# Patient Record
Sex: Female | Born: 1975 | Race: Black or African American | Hispanic: No | Marital: Married | Smoking: Former smoker
Health system: Southern US, Community
[De-identification: ages and names within clinical notes are randomized; demographics above are authoritative.]

## PROBLEM LIST (undated history)

## (undated) DIAGNOSIS — F329 Major depressive disorder, single episode, unspecified: Secondary | ICD-10-CM

## (undated) DIAGNOSIS — E669 Obesity, unspecified: Secondary | ICD-10-CM

## (undated) DIAGNOSIS — E785 Hyperlipidemia, unspecified: Secondary | ICD-10-CM

## (undated) DIAGNOSIS — F32A Depression, unspecified: Secondary | ICD-10-CM

## (undated) DIAGNOSIS — M797 Fibromyalgia: Secondary | ICD-10-CM

## (undated) HISTORY — DX: Major depressive disorder, single episode, unspecified: F32.9

## (undated) HISTORY — PX: ABDOMINAL HYSTERECTOMY: SHX81

## (undated) HISTORY — PX: HERNIA REPAIR: SHX51

## (undated) HISTORY — DX: Hyperlipidemia, unspecified: E78.5

## (undated) HISTORY — DX: Depression, unspecified: F32.A

## (undated) HISTORY — DX: Fibromyalgia: M79.7

## (undated) HISTORY — PX: TUBAL LIGATION: SHX77

## (undated) HISTORY — DX: Obesity, unspecified: E66.9

---

## 1997-08-12 ENCOUNTER — Emergency Department (HOSPITAL_COMMUNITY): Admission: EM | Admit: 1997-08-12 | Discharge: 1997-08-12 | Payer: Self-pay | Admitting: Emergency Medicine

## 1997-08-13 ENCOUNTER — Ambulatory Visit (HOSPITAL_COMMUNITY): Admission: RE | Admit: 1997-08-13 | Discharge: 1997-08-13 | Payer: Self-pay | Admitting: Emergency Medicine

## 1998-04-28 ENCOUNTER — Inpatient Hospital Stay (HOSPITAL_COMMUNITY): Admission: AD | Admit: 1998-04-28 | Discharge: 1998-04-28 | Payer: Self-pay | Admitting: Obstetrics and Gynecology

## 1998-05-21 ENCOUNTER — Inpatient Hospital Stay (HOSPITAL_COMMUNITY): Admission: AD | Admit: 1998-05-21 | Discharge: 1998-05-21 | Payer: Self-pay | Admitting: Obstetrics and Gynecology

## 1998-06-11 ENCOUNTER — Inpatient Hospital Stay (HOSPITAL_COMMUNITY): Admission: AD | Admit: 1998-06-11 | Discharge: 1998-06-11 | Payer: Self-pay | Admitting: Obstetrics and Gynecology

## 1998-06-14 ENCOUNTER — Inpatient Hospital Stay (HOSPITAL_COMMUNITY): Admission: AD | Admit: 1998-06-14 | Discharge: 1998-06-14 | Payer: Self-pay | Admitting: Obstetrics & Gynecology

## 1998-06-21 ENCOUNTER — Inpatient Hospital Stay (HOSPITAL_COMMUNITY): Admission: AD | Admit: 1998-06-21 | Discharge: 1998-06-21 | Payer: Self-pay | Admitting: Obstetrics and Gynecology

## 1998-06-23 ENCOUNTER — Inpatient Hospital Stay (HOSPITAL_COMMUNITY): Admission: AD | Admit: 1998-06-23 | Discharge: 1998-06-27 | Payer: Self-pay | Admitting: Obstetrics and Gynecology

## 1998-08-18 ENCOUNTER — Emergency Department (HOSPITAL_COMMUNITY): Admission: EM | Admit: 1998-08-18 | Discharge: 1998-08-18 | Payer: Self-pay | Admitting: Emergency Medicine

## 1998-08-18 ENCOUNTER — Encounter: Payer: Self-pay | Admitting: Emergency Medicine

## 1999-10-11 ENCOUNTER — Encounter: Admission: RE | Admit: 1999-10-11 | Discharge: 1999-10-11 | Payer: Self-pay | Admitting: Emergency Medicine

## 1999-10-11 ENCOUNTER — Encounter: Payer: Self-pay | Admitting: Emergency Medicine

## 1999-10-28 ENCOUNTER — Other Ambulatory Visit: Admission: RE | Admit: 1999-10-28 | Discharge: 1999-10-28 | Payer: Self-pay | Admitting: Obstetrics and Gynecology

## 1999-12-07 ENCOUNTER — Encounter: Payer: Self-pay | Admitting: Obstetrics and Gynecology

## 1999-12-07 ENCOUNTER — Inpatient Hospital Stay (HOSPITAL_COMMUNITY): Admission: AD | Admit: 1999-12-07 | Discharge: 1999-12-07 | Payer: Self-pay | Admitting: Obstetrics and Gynecology

## 2000-05-20 ENCOUNTER — Inpatient Hospital Stay (HOSPITAL_COMMUNITY): Admission: AD | Admit: 2000-05-20 | Discharge: 2000-05-20 | Payer: Self-pay | Admitting: Obstetrics and Gynecology

## 2000-06-06 ENCOUNTER — Inpatient Hospital Stay (HOSPITAL_COMMUNITY): Admission: AD | Admit: 2000-06-06 | Discharge: 2000-06-06 | Payer: Self-pay | Admitting: Obstetrics and Gynecology

## 2000-06-18 ENCOUNTER — Inpatient Hospital Stay (HOSPITAL_COMMUNITY): Admission: AD | Admit: 2000-06-18 | Discharge: 2000-06-18 | Payer: Self-pay | Admitting: Obstetrics and Gynecology

## 2000-07-03 ENCOUNTER — Encounter: Payer: Self-pay | Admitting: *Deleted

## 2000-07-03 ENCOUNTER — Inpatient Hospital Stay (HOSPITAL_COMMUNITY): Admission: AD | Admit: 2000-07-03 | Discharge: 2000-07-07 | Payer: Self-pay | Admitting: *Deleted

## 2000-07-03 ENCOUNTER — Encounter (INDEPENDENT_AMBULATORY_CARE_PROVIDER_SITE_OTHER): Payer: Self-pay | Admitting: Specialist

## 2000-08-13 ENCOUNTER — Other Ambulatory Visit: Admission: RE | Admit: 2000-08-13 | Discharge: 2000-08-13 | Payer: Self-pay | Admitting: Obstetrics and Gynecology

## 2000-12-27 ENCOUNTER — Other Ambulatory Visit: Admission: RE | Admit: 2000-12-27 | Discharge: 2000-12-27 | Payer: Self-pay | Admitting: Obstetrics and Gynecology

## 2001-10-03 ENCOUNTER — Other Ambulatory Visit: Admission: RE | Admit: 2001-10-03 | Discharge: 2001-10-03 | Payer: Self-pay | Admitting: Obstetrics and Gynecology

## 2001-10-14 ENCOUNTER — Encounter: Admission: RE | Admit: 2001-10-14 | Discharge: 2002-01-12 | Payer: Self-pay | Admitting: Obstetrics and Gynecology

## 2002-01-13 ENCOUNTER — Encounter: Admission: RE | Admit: 2002-01-13 | Discharge: 2002-04-13 | Payer: Self-pay | Admitting: Obstetrics and Gynecology

## 2002-08-13 ENCOUNTER — Emergency Department (HOSPITAL_COMMUNITY): Admission: EM | Admit: 2002-08-13 | Discharge: 2002-08-14 | Payer: Self-pay | Admitting: Emergency Medicine

## 2002-08-14 ENCOUNTER — Encounter: Payer: Self-pay | Admitting: Emergency Medicine

## 2002-10-21 ENCOUNTER — Other Ambulatory Visit: Admission: RE | Admit: 2002-10-21 | Discharge: 2002-10-21 | Payer: Self-pay | Admitting: Obstetrics and Gynecology

## 2003-02-06 ENCOUNTER — Encounter: Admission: RE | Admit: 2003-02-06 | Discharge: 2003-02-06 | Payer: Self-pay | Admitting: Surgery

## 2003-02-11 ENCOUNTER — Encounter: Admission: RE | Admit: 2003-02-11 | Discharge: 2003-05-12 | Payer: Self-pay | Admitting: Surgery

## 2003-02-25 ENCOUNTER — Inpatient Hospital Stay (HOSPITAL_COMMUNITY): Admission: RE | Admit: 2003-02-25 | Discharge: 2003-02-25 | Payer: Self-pay | Admitting: Obstetrics & Gynecology

## 2003-03-13 ENCOUNTER — Ambulatory Visit (HOSPITAL_COMMUNITY): Admission: RE | Admit: 2003-03-13 | Discharge: 2003-03-13 | Payer: Self-pay | Admitting: Obstetrics and Gynecology

## 2003-04-22 ENCOUNTER — Encounter: Admission: RE | Admit: 2003-04-22 | Discharge: 2003-04-22 | Payer: Self-pay | Admitting: Emergency Medicine

## 2004-12-12 IMAGING — CT CT ABDOMEN W/ CM
1 of 2 series · 15 of 32 positions shown, 19 images · IV contrast ([ID] READICT  & [ID] OMNI/300)
Comparison: none

CLINICAL DATA: Right-sided lower abdominal and pelvic pain.  Previous caesarean section and tubal ligation.  Suspect adhesions or bowel obstruction.
TECHNIQUE: Spiral CT of the abdomen and pelvis was performed during administration of 150 cc Omnipaque 300 intravenous contrast.  Oral contrast was also administered.
 ABDOMEN CT WITH CONTRAST
 The abdominal parenchymal organs are normal in appearance.  The gallbladder is unremarkable.  There is no evidence of abnormal soft tissue masses or lymphadenopathy.
 There is no evidence of dilated bowel loops.  There is no evidence of inflammatory process or abnormal fluid collections in the abdomen.
 IMPRESSION 
 Negative abdomen CT.
 PELVIS CT WITH CONTRAST
 There is no evidence of pelvic masses or adenopathy.  The uterus is normal in size.  There is no evidence of inflammatory process or abnormal fluid collections within the abdomen.  There is no evidence of dilated bowel loops.  There is no evidence of hernia or other significant abnormality.
 Negative pelvis CT.
 [REDACTED]

[Series 2: — · axial · 0.66mm/px · z∈[-398,-40]mm · 15 of 106 slices shown, 19 images]
[im 5/106  soft-tissue]
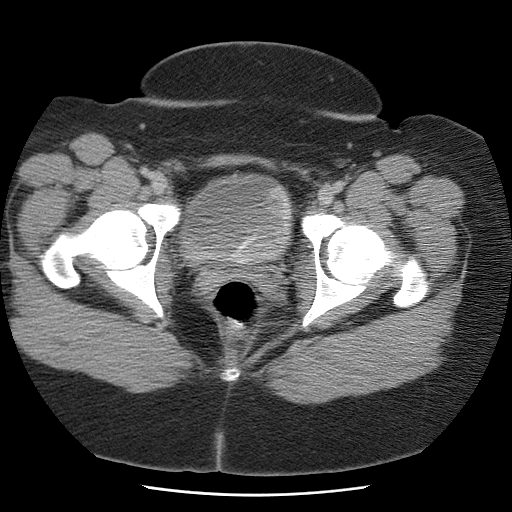
[im 5/106  bone]
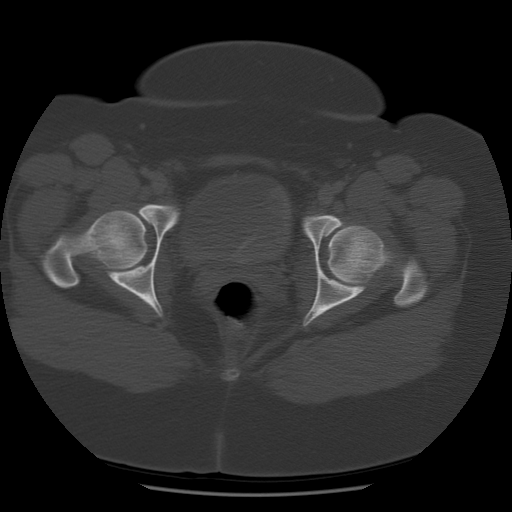
[im 13/106  soft-tissue]
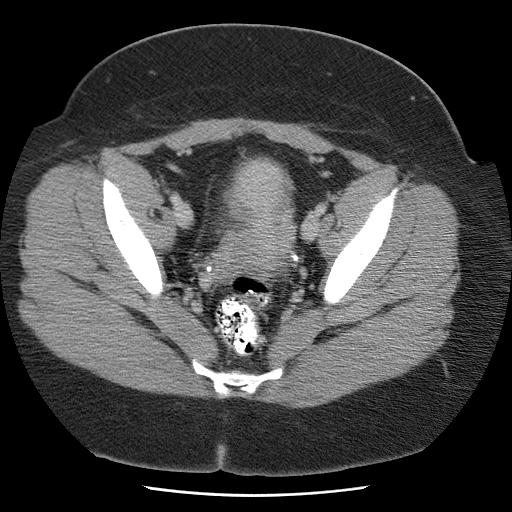
[im 21/106  soft-tissue]
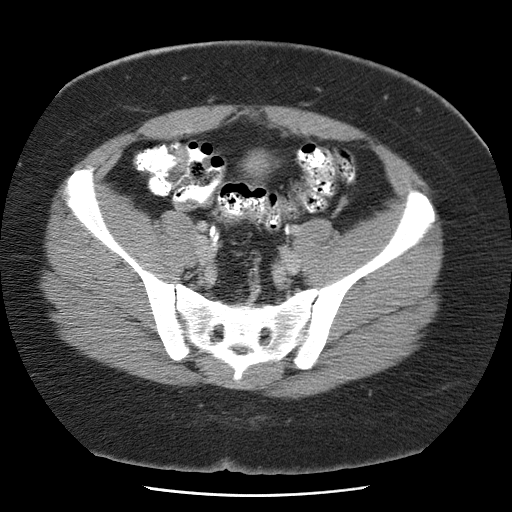
[im 29/106  soft-tissue]
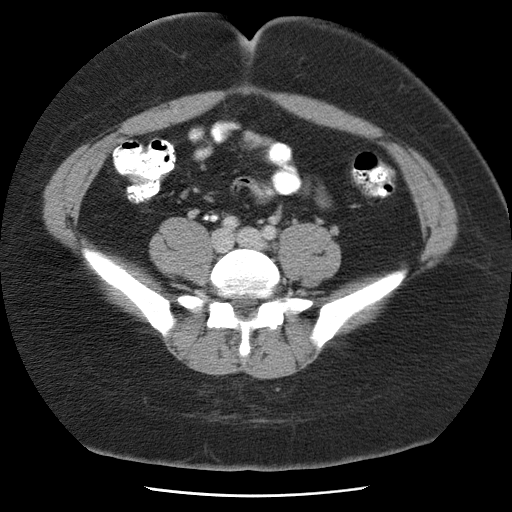
[im 37/106  soft-tissue]
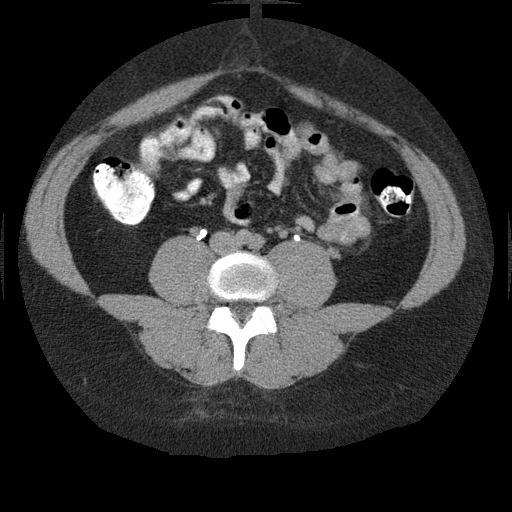
[im 45/106  soft-tissue]
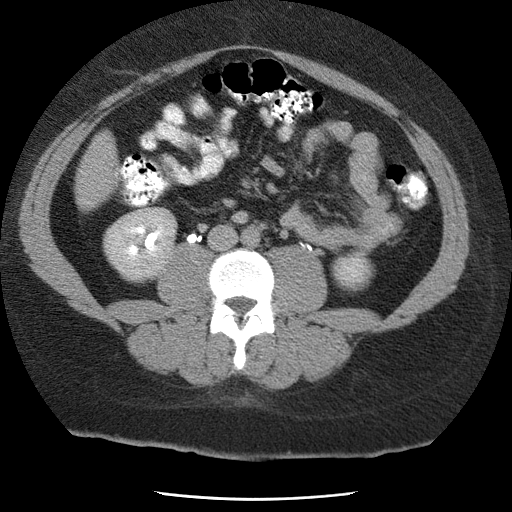
[im 53/106  soft-tissue]
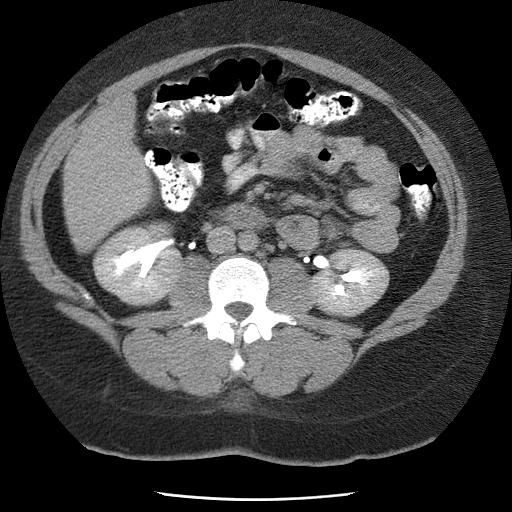
[im 61/106  soft-tissue]
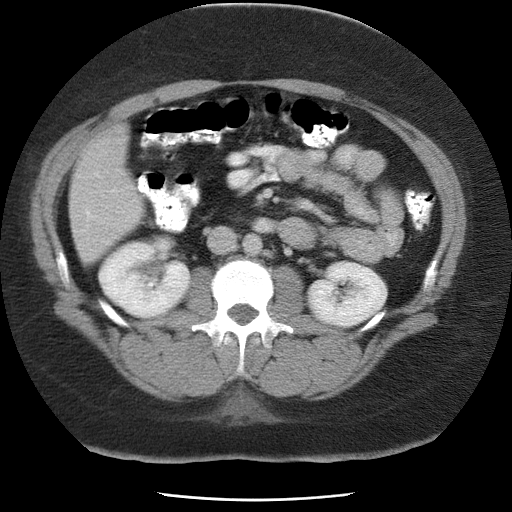
[im 69/106  soft-tissue]
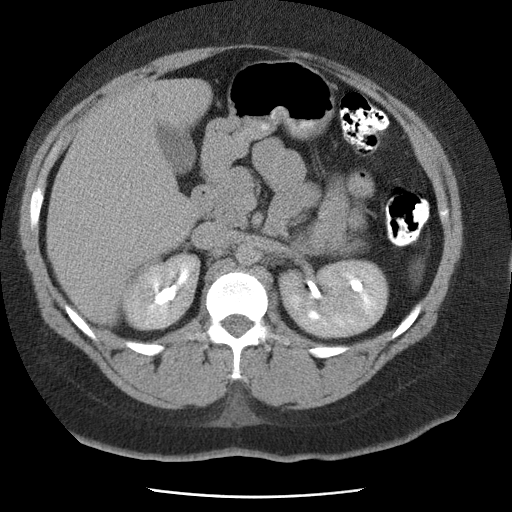
[im 69/106  bone]
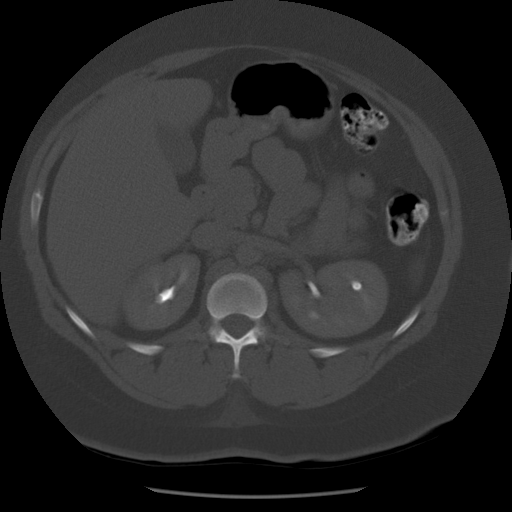
[im 77/106  soft-tissue]
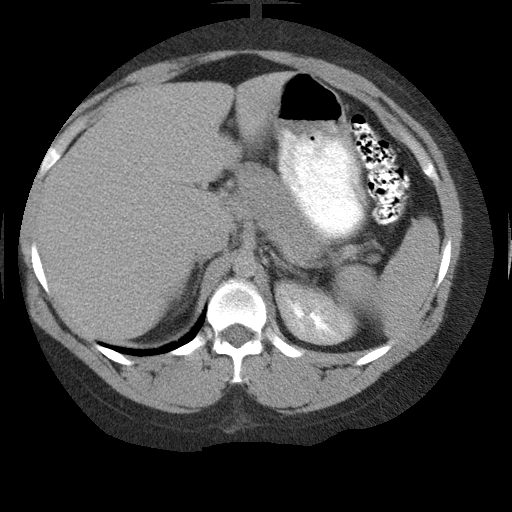
[im 85/106  soft-tissue]
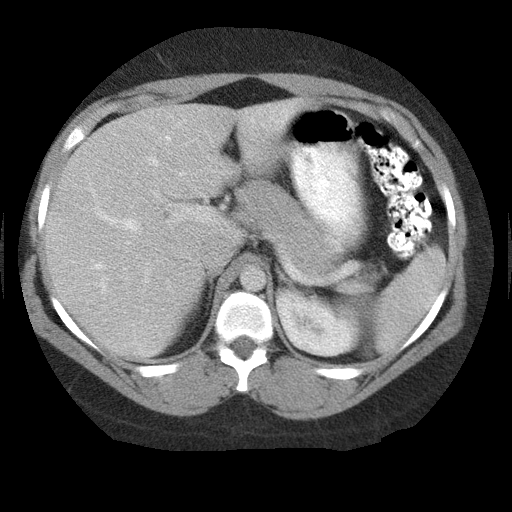
[im 89/106  lung]
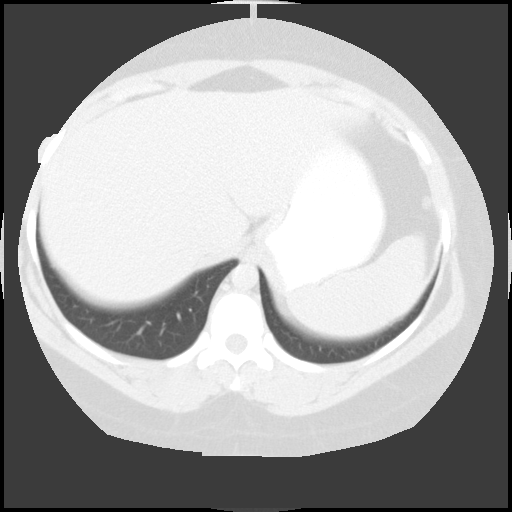
[im 93/106  soft-tissue]
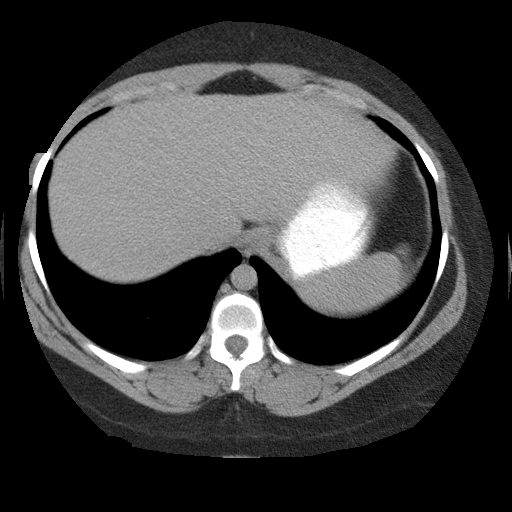
[im 93/106  lung]
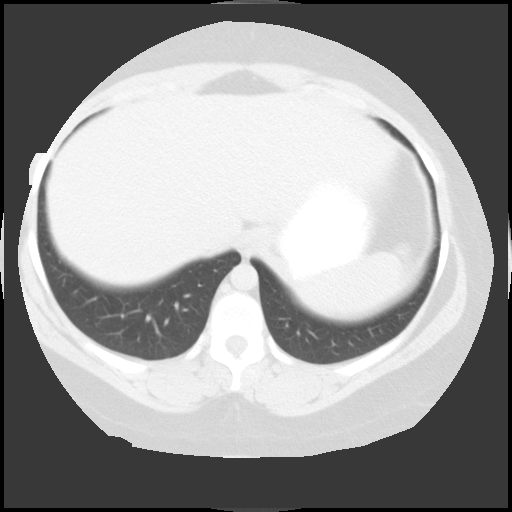
[im 97/106  lung]
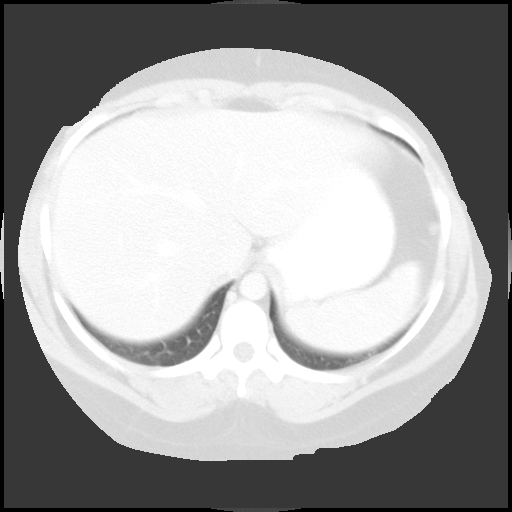
[im 101/106  soft-tissue]
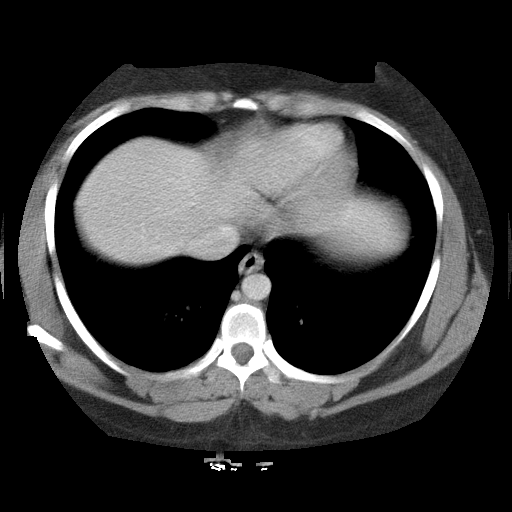
[im 101/106  lung]
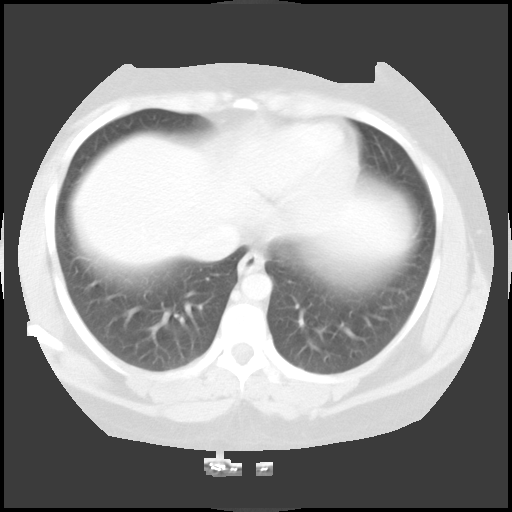

[15 of 32 positions shown; findings below may reference images not displayed]

## 2004-12-20 ENCOUNTER — Emergency Department (HOSPITAL_COMMUNITY): Admission: EM | Admit: 2004-12-20 | Discharge: 2004-12-20 | Payer: Self-pay | Admitting: Family Medicine

## 2004-12-28 ENCOUNTER — Emergency Department (HOSPITAL_COMMUNITY): Admission: EM | Admit: 2004-12-28 | Discharge: 2004-12-28 | Payer: Self-pay | Admitting: Emergency Medicine

## 2005-09-06 ENCOUNTER — Emergency Department (HOSPITAL_COMMUNITY): Admission: EM | Admit: 2005-09-06 | Discharge: 2005-09-06 | Payer: Self-pay | Admitting: Emergency Medicine

## 2005-10-02 ENCOUNTER — Emergency Department (HOSPITAL_COMMUNITY): Admission: EM | Admit: 2005-10-02 | Discharge: 2005-10-02 | Payer: Self-pay | Admitting: *Deleted

## 2005-10-19 ENCOUNTER — Emergency Department (HOSPITAL_COMMUNITY): Admission: EM | Admit: 2005-10-19 | Discharge: 2005-10-20 | Payer: Self-pay | Admitting: Emergency Medicine

## 2006-05-07 ENCOUNTER — Emergency Department (HOSPITAL_COMMUNITY): Admission: EM | Admit: 2006-05-07 | Discharge: 2006-05-07 | Payer: Self-pay | Admitting: Family Medicine

## 2006-05-14 ENCOUNTER — Emergency Department (HOSPITAL_COMMUNITY): Admission: EM | Admit: 2006-05-14 | Discharge: 2006-05-14 | Payer: Self-pay | Admitting: Emergency Medicine

## 2006-09-11 ENCOUNTER — Emergency Department (HOSPITAL_COMMUNITY): Admission: EM | Admit: 2006-09-11 | Discharge: 2006-09-11 | Payer: Self-pay | Admitting: Emergency Medicine

## 2007-01-08 ENCOUNTER — Encounter (INDEPENDENT_AMBULATORY_CARE_PROVIDER_SITE_OTHER): Payer: Self-pay | Admitting: Obstetrics and Gynecology

## 2007-01-08 ENCOUNTER — Ambulatory Visit (HOSPITAL_COMMUNITY): Admission: RE | Admit: 2007-01-08 | Discharge: 2007-01-09 | Payer: Self-pay | Admitting: Obstetrics and Gynecology

## 2007-12-19 ENCOUNTER — Emergency Department (HOSPITAL_COMMUNITY): Admission: EM | Admit: 2007-12-19 | Discharge: 2007-12-19 | Payer: Self-pay | Admitting: Emergency Medicine

## 2008-12-01 ENCOUNTER — Inpatient Hospital Stay (HOSPITAL_COMMUNITY): Admission: AD | Admit: 2008-12-01 | Discharge: 2008-12-01 | Payer: Self-pay | Admitting: Obstetrics and Gynecology

## 2009-11-12 ENCOUNTER — Other Ambulatory Visit: Admission: RE | Admit: 2009-11-12 | Discharge: 2009-11-12 | Payer: Self-pay | Admitting: Family Medicine

## 2010-02-23 ENCOUNTER — Encounter: Admission: RE | Admit: 2010-02-23 | Discharge: 2010-02-23 | Payer: Self-pay | Admitting: Family Medicine

## 2010-03-21 ENCOUNTER — Ambulatory Visit (HOSPITAL_COMMUNITY)
Admission: RE | Admit: 2010-03-21 | Discharge: 2010-03-23 | Payer: Self-pay | Source: Home / Self Care | Attending: General Surgery | Admitting: General Surgery

## 2010-04-30 ENCOUNTER — Encounter: Payer: Self-pay | Admitting: Emergency Medicine

## 2010-06-20 LAB — CBC
HCT: 36.9 % (ref 36.0–46.0)
Hemoglobin: 11.8 g/dL — ABNORMAL LOW (ref 12.0–15.0)
MCH: 27.3 pg (ref 26.0–34.0)
MCHC: 32 g/dL (ref 30.0–36.0)
MCV: 85.2 fL (ref 78.0–100.0)
Platelets: 311 10*3/uL (ref 150–400)
RBC: 4.33 MIL/uL (ref 3.87–5.11)
RDW: 13.2 % (ref 11.5–15.5)
WBC: 10.9 10*3/uL — ABNORMAL HIGH (ref 4.0–10.5)

## 2010-06-20 LAB — SURGICAL PCR SCREEN
MRSA, PCR: NEGATIVE
Staphylococcus aureus: NEGATIVE

## 2010-07-16 LAB — WET PREP, GENITAL
Clue Cells Wet Prep HPF POC: NONE SEEN
Trich, Wet Prep: NONE SEEN
Yeast Wet Prep HPF POC: NONE SEEN

## 2010-07-16 LAB — URINALYSIS, ROUTINE W REFLEX MICROSCOPIC
Bilirubin Urine: NEGATIVE
Glucose, UA: NEGATIVE mg/dL
Hgb urine dipstick: NEGATIVE
Ketones, ur: NEGATIVE mg/dL
Nitrite: NEGATIVE
Protein, ur: NEGATIVE mg/dL
Specific Gravity, Urine: 1.02 (ref 1.005–1.030)
Urobilinogen, UA: 0.2 mg/dL (ref 0.0–1.0)
pH: 7.5 (ref 5.0–8.0)

## 2010-07-17 ENCOUNTER — Emergency Department (HOSPITAL_COMMUNITY): Payer: BC Managed Care – PPO

## 2010-07-17 ENCOUNTER — Emergency Department (HOSPITAL_COMMUNITY)
Admission: EM | Admit: 2010-07-17 | Discharge: 2010-07-17 | Disposition: A | Discharge status: Home / Self Care | Payer: BC Managed Care – PPO | Attending: Emergency Medicine | Admitting: Emergency Medicine

## 2010-07-17 DIAGNOSIS — R05 Cough: Secondary | ICD-10-CM | POA: Insufficient documentation

## 2010-07-17 DIAGNOSIS — J3489 Other specified disorders of nose and nasal sinuses: Secondary | ICD-10-CM | POA: Insufficient documentation

## 2010-07-17 DIAGNOSIS — R059 Cough, unspecified: Secondary | ICD-10-CM | POA: Insufficient documentation

## 2010-07-17 DIAGNOSIS — R0602 Shortness of breath: Secondary | ICD-10-CM | POA: Insufficient documentation

## 2010-07-17 LAB — BASIC METABOLIC PANEL
BUN: 9 mg/dL (ref 6–23)
CO2: 23 mEq/L (ref 19–32)
Calcium: 9 mg/dL (ref 8.4–10.5)
Chloride: 102 mEq/L (ref 96–112)
Creatinine, Ser: 0.66 mg/dL (ref 0.4–1.2)
GFR calc Af Amer: >60 mL/min (ref >60)
GFR calc non Af Amer: >60 mL/min (ref >60)
Glucose, Bld: 93 mg/dL (ref 70–99)
Potassium: 3.9 mEq/L (ref 3.5–5.1)
Sodium: 137 mEq/L (ref 135–145)

## 2010-07-17 LAB — CBC
HCT: 36.1 % (ref 36.0–46.0)
Hemoglobin: 11.6 g/dL — ABNORMAL LOW (ref 12.0–15.0)
MCH: 26.8 pg (ref 26.0–34.0)
MCHC: 32.1 g/dL (ref 30.0–36.0)
MCV: 83.4 fL (ref 78.0–100.0)
Platelets: 323 10*3/uL (ref 150–400)
RBC: 4.33 MIL/uL (ref 3.87–5.11)
RDW: 13.7 % (ref 11.5–15.5)
WBC: 12.7 10*3/uL — ABNORMAL HIGH (ref 4.0–10.5)

## 2010-07-17 LAB — DIFFERENTIAL
Basophils Absolute: 0 10*3/uL (ref 0.0–0.1)
Basophils Relative: 0 % (ref 0–1)
Eosinophils Absolute: 0.2 10*3/uL (ref 0.0–0.7)
Eosinophils Relative: 2 % (ref 0–5)
Lymphocytes Relative: 27 % (ref 12–46)
Lymphs Abs: 3.4 10*3/uL (ref 0.7–4.0)
Monocytes Absolute: 0.6 10*3/uL (ref 0.1–1.0)
Monocytes Relative: 5 % (ref 3–12)
Neutro Abs: 8.5 10*3/uL — ABNORMAL HIGH (ref 1.7–7.7)
Neutrophils Relative %: 67 % (ref 43–77)

## 2010-07-17 LAB — D-DIMER, QUANTITATIVE: D-Dimer, Quant: <0.22 ug/mL-FEU (ref 0.00–0.48)

## 2010-07-17 LAB — POCT CARDIAC MARKERS
CKMB, poc: <1 ng/mL — ABNORMAL LOW (ref 1.0–8.0)
Myoglobin, poc: 75.2 ng/mL (ref 12–200)
Troponin i, poc: <0.05 ng/mL (ref 0.00–0.09)

## 2010-07-17 LAB — BRAIN NATRIURETIC PEPTIDE: Pro B Natriuretic peptide (BNP): <30 pg/mL (ref 0.0–100.0)

## 2010-08-23 NOTE — H&P (Signed)
Ana Wolfe, Ana Wolfe               ACCOUNT NO.:  [id number]   MEDICAL RECORD NO.:  [id number]          PATIENT TYPE:  AMB   LOCATION:  SDC                           FACILITY:  [facility]   PHYSICIAN:  Guy Sandifer. Henderson Cloud, M.D. DATE OF BIRTH:  Mar 21, 1976   DATE OF ADMISSION:  01/08/2007  DATE OF DISCHARGE:                              HISTORY & PHYSICAL   PRIORITY PREADMISSION HISTORY AND PHYSICAL   CHIEF COMPLAINT:  Heavy menses.   HISTORY OF PRESENT ILLNESS:  This patient is a 35 year old, married,  black female, G3, P2, status post tubal ligation, with increasingly  heavy menses.  She changes a tampon and a pad every 30 to 60 minutes and  overflows pads.  Ultrasound in my office on October 16, 2006, revealed the  uterus measuring 9.5 x 4.5 x 5.8 cm.  Sonohystogram was negative for  intracavitary masses.  The right ovary contained a 1.9 cm simple cyst at  that time.  After a discussion of options and management, she is being  admitted for laparoscopically-assisted vaginal hysterectomy.  Potential  risks and complications have been discussed with the patient  preoperatively.  Of note, the patient adamantly declines transfusion of  any human blood products.  However, she will accept synthetic blood  expanders.   PAST MEDICAL HISTORY:  1. HSV.  2. History of pelvic adhesions.   PAST SURGICAL HISTORY:  1. Laparoscopy with lysis of adhesions in 2005.  2. Left knee arthroscopy.   OBSTETRIC HISTORY:  Cesarean section x2.   FAMILY HISTORY:  Negative for coronary artery disease, cancer, and  diabetes.   MEDICATIONS:  None.  No known drug allergies   SOCIAL HISTORY:  Denies tobacco, alcohol, or drug abuse.   REVIEW OF SYSTEMS:  NEURO:  Denies headache.  CARDIO:  No chest pain.  PULMONARY:  Denies shortness of breath.  GI:  Denies recent changes in  bowel habits.   PHYSICAL EXAMINATION:  VITAL SIGNS:  Height 5 feet 0 inches, weight 233  pounds, blood pressure 120/84.  HEENT:  Without  thyromegaly.  LUNGS:  Clear to auscultation.  HEART:  Regular rate and rhythm.  BACK:  Without CVA tenderness.  BREASTS:  Without masses or nipple discharge.  ABDOMEN:  Obese, soft, and nontender without palpable masses.  PELVIC EXAM:  Vagina and cervix without lesion; uterus normal size,  mobile, and nontender; adnexa nontender without masses.  EXTREMITIES:  Grossly within normal limits.  NEUROLOGICAL EXAM:  Grossly within normal limits.   ASSESSMENT:  Menorrhagia.   PLAN:  Laparoscopically-assisted vaginal hysterectomy.      Guy Sandifer Henderson Cloud, M.D.  Electronically Signed     JET/MEDQ  D:  01/02/2007  T:  01/02/2007  Job:  [id number]

## 2010-08-23 NOTE — Discharge Summary (Signed)
[name], Ana Wolfe               ACCOUNT NO.:  [id number]   MEDICAL RECORD NO.:  [id number]          PATIENT TYPE:  OIB   LOCATION:  9307                          FACILITY:  [facility]   PHYSICIAN:  Guy Sandifer. Henderson Cloud, M.D. DATE OF BIRTH:  June 12, 1975   DATE OF ADMISSION:  01/08/2007  DATE OF DISCHARGE:  01/09/2007                               DISCHARGE SUMMARY   PROCEDURE:  On January 08, 2007, laparoscopically-assisted vaginal  hysterectomy.   REASON FOR ADMISSION:  This patient is a 35 year old married black  female G3, P2 status post tubal integration with increasingly heavy  menses.  Details are dictated in the history and physical.  She is  admitted for surgical management.   HOSPITAL COURSE:  The patient is taken to the operating room and  undergoes the above procedure.  Estimated blood loss is 150 mL.  On the  evening of surgery she has good pain relief.  She had some nausea and  vomiting with food but was feeling better after that.  Vital signs were  stable and she was afebrile with clear urine output.  On the day of  discharge she is tolerating regular diet, passing flatus and has good  pain control.  Vital signs are stable.  She remains afebrile.  Hemoglobin is 10.5 and pathology is pending.   CONDITION ON DISCHARGE:  Good.   DIET:  Regular as tolerated.   ACTIVITY:  No lifting, no operation of automobiles, no vaginal entry.  She is to call the office for problems including, but not limited to  temperature of 101 degrees, persistent nausea, vomiting, heavy bleeding  or increasing pain.   MEDICATIONS:  1. Percocet 5/325 mg #40 one to two p.o. q.6h. p.r.n.  2. Ibuprofen 600 mg q.6h. p.r.n.  3. Iron tablet daily.   FOLLOW-UP:  In the office in two weeks.      Guy Sandifer Henderson Cloud, M.D.  Electronically Signed     JET/MEDQ  D:  01/09/2007  T:  01/09/2007  Job:  [id number]

## 2010-08-23 NOTE — Op Note (Signed)
Ana Wolfe, Ana Wolfe               ACCOUNT NO.:  [id number]   MEDICAL RECORD NO.:  [id number]          PATIENT TYPE:  OIB   LOCATION:  9307                          FACILITY:  [facility]   PHYSICIAN:  Guy Sandifer. Henderson Cloud, M.D. DATE OF BIRTH:  May 02, 1975   DATE OF PROCEDURE:  01/08/2007  DATE OF DISCHARGE:                               OPERATIVE REPORT   PREOPERATIVE DIAGNOSIS:  Menorrhagia.   POSTOPERATIVE DIAGNOSIS:  Menorrhagia.   PROCEDURE:  Laparoscopically assisted vaginal hysterectomy.   SURGEON:  Guy Sandifer. Henderson Cloud, M.D.   ASSISTANT:  Juluis Mire, M.D.   ANESTHESIA:  General with endotracheal intubation.   SPECIMENS:  Uterus to Pathology.   ESTIMATED BLOOD LOSS:  150 mL.   INDICATIONS AND CONSENT:  This patient is a 35 year old married black  female G3, P2, status post tubal ligation with increasingly heavy  menses.  Details are dictated in the history and physical.  The patient  is admitted for surgical management.  Laparoscopically assisted vaginal  hysterectomy and removal of an ovary only if distinctly abnormal have  been discussed preoperatively.  Potential risks and complications are  discussed including, but limited to, infection, organ damage, bleeding,  DVT, PE and pneumonia.  Laparotomy, fistula formation and dyspareunia  are discussed.  The patient adamantly refuses the transfusion of blood  products even in the event of life-threatening hemorrhage.  She will  except synthetic products of any sort.  All questions have been answered  and consent is signed and on the chart.   FINDINGS:  There are omental adhesions to the anterior abdominal wall  superior to the umbilicus.  Uterus is about 10 weeks in size.  There are  filmy adhesions of the peritoneum to the left lower uterine segment.  Posterior cul-de-sac is normal.  Ovaries are normal bilaterally.   PROCEDURE:  The patient is taken to the operating room, where she is  identified, placed in the dorsal supine  position and general anesthesia  is induced via endotracheal intubation.  She is then placed in the  dorsal lithotomy position, where she is prepped abdominally and  vaginally, bladder straight-catheterized, Hulka tenaculum is placed in  the uterus as manipulator and she is draped in a sterile fashion.  The  infraumbilical and suprapubic areas are injected in the midline with  0.5% plain Marcaine.  An infraumbilical incision is made and dissection  is carried out in layers to the peritoneum, which is bluntly entered  with a fingertip.  The fascia is marked at the 3  and 9 o'clock  positions with a 0 Vicryl suture, which is placed under good  visualization.  The disposable Hasson trocar sleeve is then placed and  anchored down with the angle sutures.  Pneumoperitoneum is induced and  the above findings are noted.  A small suprapubic incision is made and a  5-mm Xcel bladeless disposable trocar sleeve is placed under direct  visualization without difficulty.  The adhesions on the left lower  uterine segment and around the left round ligament are taken down  sharply and bluntly without difficulty.  Then using the Gyrus  bipolar  cautery cutting instrument, the proximal ligaments are taken down  bilaterally to the level of the vesicouterine peritoneum.  The  vesicouterine peritoneum is taken down cephalolaterally as well.  Good  hemostasis is noted.  Instruments are removed and the suprapubic trocar  sleeve is removed and attention is turned to the vagina.  Posterior cul-  de-sac is entered sharply and the cervix was circumscribed with unipolar  cautery.  Mucosa is advanced sharply and bluntly.  Posterior cul-de-sac  is entered.  The progressive bites are taken with the Gyrus bipolar  cautery instrument of the uterosacral ligaments, bladder pillars,  cardinal ligaments and uterine vessels bilaterally.  Anterior cul-de-sac  is entered during this process without difficulty.  The fundus is   delivered posteriorly and the specimen is completely delivered from the  field.  The uterosacral ligaments are then plicated to the vaginal cuff  bilaterally with separate sutures of 0 Monocryl.  A third suture of 0  Monocryl was used to reapproximate the uterosacral ligaments in the  midline.  Cuff is then closed with figure-of-eight 0 Monocryl suture.  Foley catheter is placed in the bladder and clear urine is noted.  Attention is returned to the abdomen.  Pneumoperitoneum is reintroduced  and under direct visualization, the 5-mm bladeless trocar sleeve is  reintroduced under direct visualization.  Copious irrigation is carried  out and careful inspection under reduced pneumoperitoneum reveals  excellent hemostasis all around.  Excess fluid is removed.  Suprapubic  trocar sleeve is removed and the Hasson disposable trocar sleeve is  removed as well.  The angle sutures are tied in the midline.  The defect  in the fascia is completely closed with the 0 Monocryl with bites taken  under good visualization.  The skin is closed with 3-0 Vicryl with  simple sutures.  Dermabond is placed on both incisions.  All counts are  correct.  The patient is awakened and taken to the recovery room in  stable condition.      Guy Sandifer Henderson Cloud, M.D.  Electronically Signed     JET/MEDQ  D:  01/08/2007  T:  01/08/2007  Job:  [id number]

## 2010-08-26 NOTE — Discharge Summary (Signed)
[facility]  Patient:    Ana Wolfe, Ana Wolfe                      MRN: [id number] Adm. Date:  [id number] Disc. Date: [id number] Attending:  Donne Hazel Dictator:   Danie Chandler, R.N.                           Discharge Summary  ADMITTING DIAGNOSES:          1. Intrauterine pregnancy at [redacted] weeks gestation.                               2. Labor.                               3. Abdominal pain suspicious for either uterine                                  scar dehiscence or occult placental                                  abruption.  DISCHARGE DIAGNOSES:          1. Intrauterine pregnancy at [redacted] weeks gestation.                               2. Labor.                               3. Abdominal pain suspicious for either uterine                                  scar dehiscence or occult placental                                  abruption.  PROCEDURE:                    On July 03, 2000 repeat low transverse cesarean section and bilateral tubal ligation.  REASON FOR ADMISSION:         The patient is a 35 year old married black female gravida 3, para 1 with an estimated date of confinement of July 31, 2000 with prenatal care complicated by a previous cesarean section, uterine leiomyoma, history of positive group B strep, and vulvar herpes.  The patient complained of approximately one week of waxing and waning low midline pain radiating to bilateral lower back.  The pain was becoming progressively worse. The patient was evaluated in the office where she was crying with the pain. The patient was transferred to the triage unit for further evaluation.  Fetal heart tones were reactive and the patient was having mild contractions approximately every three to six minutes.  The patient denied vaginal bleeding, trauma, fever, or rupture of membranes.  The patient was found to be afebrile.  PIH studies, coagulation studies, and straight urinalysis  were normal.  An ultrasound revealed normal amniotic fluid volume  and an anterior grade 2 placenta.  The patient was given IV fluids and continued to complain of significant suprapubic pain.  The uterus was soft but she was tender suprapubically.  No CVA tenderness was noted.  The possibilities of uterine scar separation or occult placental abruption were discussed.  The recommendation for cesarean section was made in view of the patients continued pain.  HOSPITAL COURSE:              The patient was taken to the operating room and underwent the above named procedure without complication.  This was productive of a viable female infant with Apgars of 8 at one minute and 9 at five minutes.  Postoperatively on day #1 the patient had a good return of bowel function and good pain control.  Hemoglobin was 9.3, hematocrit 27.9, and white blood cell count 12.2.  On postoperative day #2 the patient was without complaints.  She was tolerating a regular diet and ambulating well without difficulty.  She was discharged home on postoperative day #4.  CONDITION ON DISCHARGE:       Good.  DIET:                         Regular, as tolerated.  ACTIVITY:                     No heavy lifting, no driving, no vaginal entry.  FOLLOW-UP:                    She is to follow-up in the office in one to two weeks for incision check.  She is to call for temperature greater than 100 degrees, persistent nausea or vomiting, heavy vaginal bleeding, and/or redness or drainage from the incision site.  DISCHARGE MEDICATIONS:        1. Prenatal vitamins one p.o. q.d.                               2. Tylox #30 with no refill as directed by M.D.                               3. Ibuprofen as directed by M.D.DD:  07/20/00 TD:  07/20/00 Job: [id number]

## 2010-08-26 NOTE — Op Note (Signed)
[facility]  Patient:    Ana Wolfe, Ana Wolfe                      MRN: [id number] Proc. Date: 07/03/00 Adm. Date:  [id number] Attending:  Donne Hazel                           Operative Report  PREOPERATIVE DIAGNOSES:       1. Intrauterine pregnancy at 36-0/7 weeks.                               2. Labor.                               3. Abdominal pain suspicious for either                                  uterine scar dehiscence or occult placental                                  abruption.  POSTOPERATIVE DIAGNOSES:      1. Intrauterine pregnancy at 36-0/7 weeks.                               2. Labor.                               3. Abdominal pain suspicious for either                                  uterine scar dehiscence or occult placental                                  abruption.  PROCEDURES:                   1. Repeat low transverse cesarean section.                               2. Bilateral tubal ligation.  SURGEON:                      Guy Sandifer. Arleta Creek, M.D.  ASSISTANTWilley Blade, M.D.  ANESTHESIA:                   Spinal.  ANESTHESIOLOGIST:             Octaviano Glow. Pamalee Leyden, M.D.  ESTIMATED BLOOD LOSS:         500 cc.  FINDINGS:                     Viable female infant.  Apgars of 8 and 9 at one and five minutes respectively.  Birth weight and arterial cord pH pending.  INDICATIONS AND  CONSENT:      This patient is a 35 year old married black female, G3, P1, AB1 with an EDC of July 31, 2000, with prenatal care complicated by previous cesarean section, uterine leiomyoma, history of positive group B Strep and vulvar herpes.  The patient complained of approximately one week of waxing and waning low midline pain radiating to bilateral lower back.  The pain is becoming progressively worse.  She was evaluated in the office, where she was crying with the pain.  She was transferred to triage for further  evaluation.  Fetal heart tones were reactive and the patient was having mild contractions approximately every 3-6 minutes. The patient denied vaginal bleeding, trauma, fever or rupture of membranes. The patient was found to be afebrile.  PIH studies, coagulation studies and straight urinalysis were normal.  Ultrasound revealed normal amniotic fluid volume and an anterior grade 2 placenta.  The patient was given IV fluids and continued to complain of significant suprapubic pain.  The uterus was soft, although she was quite tender suprapubically.  No CVA tenderness was noted. The possibilities of uterine scar separation or occult placental abruption were discussed with the patient and her husband.  Possible catastrophic consequences if this should extend and cause fetal distress were discussed. The fact that the baby was 36 weeks, and therefore premature, and was subject to fetal lung immaturity or other problems of prematurity were discussed.  The recommendation for cesarean section was made in view of the patients continued pain.  The patient requested tubal ligation.  The issue of tubal ligation with the premature baby was discussed.  The permanence was emphasized.  The possible failure and increased ectopic risk was also discussed.  The risks of cesarean section were discussed.  The patient and husband were adamant in that they did not want transfusion of blood products. They stated quite clearly this was true even in the event of life threatening bleeding.  They stated this was for religious purposes.  All questions were answered and consent was signed on the chart.  DESCRIPTION OF PROCEDURE:     The patient was taken to the operating room, where a spinal anesthetic was placed.  She was placed I the dorsal supine position with a 15 degree left lateral wedge, where she was prepped and draped in a sterile fashion.  A Foley catheter was already in place.  After testing for adequate spinal  anesthesia, the skin was entered through the previous Pfannenstiel scar and dissection was carried out in layers to the peritoneum. The peritoneum was incised and extended superiorly and inferiorly.  The vesicouterine peritoneum was taken down cephalolaterally.  A bladder flap was developed and a bladder blade was placed.  There was no obvious rupture of the uterine scar noted.  The uterus was then incised in a low transverse manner and the uterine cavity was entered bluntly with a Kelly clamp.  The uterine incision was then extended cephalolaterally with the fingers.  Artificial rupture of membranes was carried out for clear fluid.  The vertex was delivered and the oral and nasopharynx were suctioned.  The remainder of the infant was then delivered.  Good cry and tone was noted and the cord was clamped and cut.  The infant was handed to the waiting pediatrics team.  The placenta was manually delivered and sent to pathology.  The uterine cavity was palpated clean and normal in contour.  The uterus was closed in a running locking fashion with a 0 Monocryl suture, which achieved  good hemostasis. Then, after again confirming with the patient that she wanted her tubal ligation done, the left fallopian tube was identified from cornu to fimbria. A window in the mesosalpinx was created with cautery.  Two 0 pain free ties were placed around the fallopian tube and the intervening section of tube was sharply resected.  Minimal cautery was used to assure hemostasis.  A similar procedure was carried out on the right fallopian tube.  The ovaries were normal.  There were some omental adhesions to the left uterine fundus, which were also taken down with cautery without difficulty.  The uterus was returned to the abdomen.  Irrigation was carried out and all returns were clear.  Good hemostasis was noted all around.  The anterior peritoneum was closed in a running fashion with 0 Monocryl suture, which was  also used to reapproximate the pyramidalis muscle in the midline.  The anterior rectus fascia was closed in a running fashion with 0 Panacryl suture.  The skin was closed with clips.  All sponge, needle and instrument counts were correct.  The patient was transferred to the recovery room in stable condition. DD:  07/03/00 TD:  07/04/00 Job: [id number]

## 2010-08-31 ENCOUNTER — Other Ambulatory Visit (INDEPENDENT_AMBULATORY_CARE_PROVIDER_SITE_OTHER): Payer: Self-pay | Admitting: General Surgery

## 2010-08-31 DIAGNOSIS — R1011 Right upper quadrant pain: Secondary | ICD-10-CM

## 2010-09-01 ENCOUNTER — Ambulatory Visit
Admission: RE | Admit: 2010-09-01 | Discharge: 2010-09-01 | Disposition: A | Discharge status: Home / Self Care | Payer: BLUE CROSS/BLUE SHIELD | Source: Ambulatory Visit | Attending: General Surgery | Admitting: General Surgery

## 2010-09-01 ENCOUNTER — Encounter: Payer: Self-pay | Admitting: Gastroenterology

## 2010-09-01 DIAGNOSIS — R1011 Right upper quadrant pain: Secondary | ICD-10-CM

## 2010-09-08 ENCOUNTER — Ambulatory Visit (INDEPENDENT_AMBULATORY_CARE_PROVIDER_SITE_OTHER): Payer: BC Managed Care – PPO | Admitting: Gastroenterology

## 2010-09-08 ENCOUNTER — Encounter: Payer: Self-pay | Admitting: Gastroenterology

## 2010-09-08 VITALS — BP 110/80 | HR 72 | Ht 60.0 in | Wt 249.0 lb

## 2010-09-08 DIAGNOSIS — R1011 Right upper quadrant pain: Secondary | ICD-10-CM | POA: Insufficient documentation

## 2010-09-08 MED ORDER — SULINDAC 200 MG PO TABS: 200.0000 mg | ORAL_TABLET | Freq: Two times a day (BID) | ORAL | Status: AC

## 2010-09-08 NOTE — Assessment & Plan Note (Addendum)
Pain is most likely due to musculoskeletal pain. There is no obvious hernia. Recent ultrasound was negative.  Recommendations #1 trial of Clinoril 200 mg twice a day for 14 days. Patient was instructed to call back if she's not improved on this regimen.

## 2010-09-08 NOTE — Patient Instructions (Signed)
We are sending in a new prescription to your pharmacy Call us back in 2 weeks if the medication is not helping

## 2010-09-08 NOTE — Progress Notes (Signed)
History of Present Illness:  Ms Rattan is a 35 year old Afro-American female referred after request of Dr. Bertram Savin for evaluation of abdominal pain. Since undergoing a laparoscopic periumbilical hernia repair several months ago she has been complaining of upper abdominal pain in the right upper quadrant upper epigastrium and left upper quadrant. It is worsened postprandially when she feels a tightness in her upper abdomen and occasionally accompanied by nausea. She denies pyrosis. She moves her bowels regularly. CT in November, 2011 demonstrated a small hernia. Ultrasound from yesterday was unrevealing.    Review of Systems: Pertinent positive and negative review of systems were noted in the above HPI section. All other review of systems were otherwise negative.    Current Medications, Allergies, Past Medical History, Past Surgical History, Family History and Social History were reviewed in [employer] electronic medical record  Vital signs were reviewed in today's medical record. Physical Exam: General: Well developed , well nourished, no acute distress Head: Normocephalic and atraumatic Eyes:  sclerae anicteric, EOMI Ears: Normal auditory acuity Mouth: No deformity or lesions Lungs: Clear throughout to auscultation Heart: Regular rate and rhythm; no murmurs, rubs or bruits Abdomen: Soft,and non distended. No masses, hepatosplenomegaly or hernias noted. Normal Bowel sounds; there is mild tenderness to palpation in the left and right upper quadrants. Tenderness increases with abdominal muscle wall flexion Rectal:deferred Musculoskeletal: Symmetrical with no gross deformities  Pulses:  Normal pulses noted Extremities: No clubbing, cyanosis, edema or deformities noted Neurological: Alert oriented x 4, grossly nonfocal Psychological:  Alert and cooperative. Normal mood and affect

## 2010-09-16 ENCOUNTER — Telehealth: Payer: Self-pay | Admitting: Gastroenterology

## 2010-09-16 NOTE — Telephone Encounter (Signed)
Pt states that the Sulindac is causing nausea and diarrhea, she had 5 stools today. She also states that the pain/knot in her abdomen is not any better. Dr. Arlyce Dice please advise.

## 2010-09-19 MED ORDER — HYOSCYAMINE SULFATE ER 0.375 MG PO TBCR: 0.3750 mg | EXTENDED_RELEASE_TABLET | Freq: Two times a day (BID) | ORAL | Status: DC

## 2010-09-19 NOTE — Telephone Encounter (Signed)
D/c clinoril. Try hyomax 0.375mg  bid. C/b 2-3 days

## 2010-09-19 NOTE — Telephone Encounter (Signed)
Pt aware of Dr. Marzetta Board recommendations and rx sent to pharmacy. Pt knows to call back in 2-3 days with an update.

## 2011-01-19 LAB — CBC
HCT: 31.1 — ABNORMAL LOW
HCT: 36.3
Hemoglobin: 10.5 — ABNORMAL LOW
Hemoglobin: 12.4
MCHC: 33.7
MCHC: 34.2
MCV: 82.5
MCV: 81.7
Platelets: 301
Platelets: 344
RBC: 3.77 — ABNORMAL LOW
RBC: 4.44
RDW: 13.7
RDW: 13.5
WBC: 14.8 — ABNORMAL HIGH
WBC: 8.9

## 2012-02-24 ENCOUNTER — Emergency Department (INDEPENDENT_AMBULATORY_CARE_PROVIDER_SITE_OTHER)
Admission: EM | Admit: 2012-02-24 | Discharge: 2012-02-24 | Disposition: A | Discharge status: Home / Self Care | Payer: BC Managed Care – PPO | Source: Home / Self Care | Attending: Emergency Medicine | Admitting: Emergency Medicine

## 2012-02-24 ENCOUNTER — Encounter (HOSPITAL_COMMUNITY): Payer: Self-pay | Admitting: Emergency Medicine

## 2012-02-24 DIAGNOSIS — J029 Acute pharyngitis, unspecified: Secondary | ICD-10-CM

## 2012-02-24 DIAGNOSIS — M25539 Pain in unspecified wrist: Secondary | ICD-10-CM

## 2012-02-24 LAB — POCT RAPID STREP A: Streptococcus, Group A Screen (Direct): NEGATIVE

## 2012-02-24 MED ORDER — NAPROXEN 500 MG PO TABS: 500.0000 mg | ORAL_TABLET | Freq: Two times a day (BID) | ORAL | Status: AC

## 2012-02-24 NOTE — ED Notes (Signed)
Pt c/o sore throat with itching and bilateral swelling and pain that radiates up to ears. Pt denies n/v/d and fever. X 11/15  Pt also having right hand pain x several months now that starts in wrist and radiates to elbow. Pt states some weakness and pain with grasping objects.

## 2012-02-24 NOTE — ED Provider Notes (Signed)
History     CSN: [id number]  Arrival date & time 02/24/12  4098   None     Chief Complaint  Patient presents with  . Sore Throat    sore throat with bilateral swelling and pain that radiates up to ears since 11/15  . Hand Pain    right hand pain for several months now gradual on set pain in wrist that radiates to elbow and some weakness with grasping objects.    (Consider location/radiation/quality/duration/timing/severity/associated sxs/prior treatment) Patient is a 36 y.o. female presenting with pharyngitis and wrist pain. The history is provided by the patient.  Sore Throat This is a new problem. The current episode started yesterday (scratchy). The problem has not changed since onset.Nothing aggravates the symptoms. Nothing relieves the symptoms. She has tried nothing for the symptoms.  Wrist Pain This is a recurrent problem. The current episode started more than 1 week ago. The problem has not changed since onset.The symptoms are aggravated by bending. The symptoms are relieved by rest. She has tried nothing for the symptoms.    Past Medical History  Diagnosis Date  . Depression     15 years  . Fibromyalgia     2 years  . Hyperlipemia   . Obesity     12 years    Past Surgical History  Procedure Date  . Abdominal hysterectomy   . Hernia repair   . Cesarean section   . Tubal ligation     Family History  Problem Relation Age of Onset  . Ulcerative colitis      [employer]  . Cancer Father     Nasal/ Pharnyx    History  Substance Use Topics  . Smoking status: Former Games developer  . Smokeless tobacco: Never Used  . Alcohol Use: Yes     Comment: occasionally    OB History    Grav Para Term Preterm Abortions TAB SAB Ect Mult Living                  Review of Systems  HENT: Positive for ear pain and sore throat.   Musculoskeletal: Positive for arthralgias.  All other systems reviewed and are negative.    Allergies  Review of patient's allergies  indicates no known allergies.  Home Medications   Current Outpatient Rx  Name  Route  Sig  Dispense  Refill  . HYOSCYAMINE SULFATE ER 0.375 MG PO TBCR   Oral   Take 1 tablet (0.375 mg total) by mouth 2 (two) times daily.   60 each   0   . NAPROXEN 500 MG PO TABS   Oral   Take 1 tablet (500 mg total) by mouth 2 (two) times daily with a meal. For one week then as needed   60 tablet   1     BP 145/87  Pulse 80  Temp 98.3 F (36.8 C) (Oral)  Resp 18  SpO2 98%  Physical Exam  Nursing note and vitals reviewed. Constitutional: She is oriented to person, place, and time. Vital signs are normal. She appears well-developed and well-nourished. She is active and cooperative.  HENT:  Head: Normocephalic.  Right Ear: Tympanic membrane normal.  Left Ear: Tympanic membrane normal.  Nose: Nose normal. Right sinus exhibits no maxillary sinus tenderness and no frontal sinus tenderness. Left sinus exhibits no maxillary sinus tenderness and no frontal sinus tenderness.  Mouth/Throat: Uvula is midline and mucous membranes are normal. Posterior oropharyngeal edema present. No oropharyngeal exudate, posterior oropharyngeal erythema  or tonsillar abscesses.  Eyes: Conjunctivae normal are normal. Pupils are equal, round, and reactive to light. No scleral icterus.  Neck: Trachea normal and normal range of motion. Neck supple. No JVD present.  Cardiovascular: Normal rate, regular rhythm, normal heart sounds, intact distal pulses and normal pulses.   Pulmonary/Chest: Effort normal and breath sounds normal.  Musculoskeletal: Normal range of motion.       Right wrist: Normal.       Right hand: Normal.       +Phalen sign  Neurological: She is alert and oriented to person, place, and time. No cranial nerve deficit or sensory deficit.  Skin: Skin is warm, dry and intact.  Psychiatric: She has a normal mood and affect. Her speech is normal and behavior is normal. Judgment and thought content normal.  Cognition and memory are normal.    ED Course  Procedures (including critical care time)   Labs Reviewed  POCT RAPID STREP A ([facility] ONLY)   No results found.   1. Pharyngitis   2. Wrist pain       MDM  Medications as prescribed.  Follow up with orthopedist if symptoms are not improved with conservative measures.          Johnsie Kindred, NP 02/24/12 1103

## 2012-02-24 NOTE — ED Notes (Signed)
Waiting discharge papers 

## 2012-02-24 NOTE — ED Provider Notes (Signed)
Medical screening examination/treatment/procedure(s) were performed by non-physician practitioner and as supervising physician I was immediately available for consultation/collaboration.  Leslee Home, M.D.   Reuben Likes, MD 02/24/12 2216

## 2012-04-04 ENCOUNTER — Encounter (HOSPITAL_COMMUNITY): Payer: Self-pay | Admitting: Emergency Medicine

## 2012-04-04 ENCOUNTER — Emergency Department (INDEPENDENT_AMBULATORY_CARE_PROVIDER_SITE_OTHER)
Admission: EM | Admit: 2012-04-04 | Discharge: 2012-04-04 | Disposition: A | Discharge status: Home / Self Care | Payer: BC Managed Care – PPO | Source: Home / Self Care | Attending: Emergency Medicine | Admitting: Emergency Medicine

## 2012-04-04 DIAGNOSIS — M545 Low back pain: Secondary | ICD-10-CM

## 2012-04-04 LAB — POCT URINALYSIS DIP (DEVICE)
Protein, ur: NEGATIVE mg/dL
Specific Gravity, Urine: 1.02 (ref 1.005–1.030)
Urobilinogen, UA: 0.2 mg/dL (ref 0.0–1.0)

## 2012-04-04 MED ORDER — DICLOFENAC SODIUM 75 MG PO TBEC: 75.0000 mg | DELAYED_RELEASE_TABLET | Freq: Two times a day (BID) | ORAL | Status: DC

## 2012-04-04 MED ORDER — KETOROLAC TROMETHAMINE 60 MG/2ML IM SOLN
INTRAMUSCULAR | Status: AC
  Filled 2012-04-04: qty 2

## 2012-04-04 MED ORDER — METHYLPREDNISOLONE ACETATE 80 MG/ML IJ SUSP: 80.0000 mg | Freq: Once | INTRAMUSCULAR | Status: DC

## 2012-04-04 MED ORDER — TRAMADOL HCL 50 MG PO TABS: 100.0000 mg | ORAL_TABLET | Freq: Three times a day (TID) | ORAL | Status: AC | PRN

## 2012-04-04 MED ORDER — METHYLPREDNISOLONE ACETATE 80 MG/ML IJ SUSP
INTRAMUSCULAR | Status: AC
  Filled 2012-04-04: qty 1

## 2012-04-04 MED ORDER — KETOROLAC TROMETHAMINE 60 MG/2ML IM SOLN: 60.0000 mg | Freq: Once | INTRAMUSCULAR | Status: DC

## 2012-04-04 NOTE — ED Provider Notes (Signed)
Chief Complaint  Patient presents with  . Back Pain    History of Present Illness:   The patient is a 36 year old female who presents with a three-day history of midline lower back pain. The patient injured her back on a weight machine years ago and she's had pain off-and-on that area ever since then. The pain is rated an 8/10 in intensity. There no obvious precipitating factors. The pain is worse with bending or lifting her legs. There is nothing particular that makes it better. She denies any numbness, tingling, or muscle weakness in lower extremities she's had no dysuria, frequency, hematuria, incontinence of bladder or bowel, or saddle anesthesia. She denies any abdominal pain. She's had no fever, chills, headache, stiff neck, or weight loss. She tried some Flexeril without any improvement.  Review of Systems:  Other than noted above, the patient denies any of the following symptoms: Systemic:  No fever, chills, severe fatigue, or unexplained weight loss. GI:  No abdominal pain, nausea, vomiting, diarrhea, constipation, incontinence of bowel, or blood in stool. GU:  No dysuria, frequency, urgency, or hematuria. No incontinence of urine or difficulty urinating.  M-S:  No neck pain, joint pain, arthritis, or myalgias. Neuro:  No paresthesias, saddle anesthesia, muscular weakness, or progressive neurological deficit.  PMFSH:  Past medical history, family history, social history, meds, and allergies were reviewed. Specifically, there is no history of cancer, major trauma, osteoporosis, immunosuppression, HIV, or IV or injection drug use.   Physical Exam:   Vital signs:  BP 116/86  Pulse 110  Temp 98.6 F (37 C) (Oral)  Resp 18  SpO2 98% General:  Alert, oriented, in no distress. Abdomen:  Soft, non-tender.  No organomegaly or mass.  No pulsatile midline abdominal mass or bruit. Back:  There was pain to palpation at the L5-S1 area midline. Her back has a limited range of motion with 60 of  forward flexion, 10 of backward flexion, 20 of sideways flexion each direction with pain. Straight leg raising was negative and Lesegue sign was negative. Neuro:  Normal muscle strength, sensations and DTRs. Extremities: Pedal pulses were full, there was no edema. Skin:  Clear, warm and dry.  No rash.  Labs:   Results for orders placed during the hospital encounter of 04/04/12  POCT URINALYSIS DIP (DEVICE)      Component Value Range   Glucose, UA NEGATIVE  NEGATIVE mg/dL   Bilirubin Urine NEGATIVE  NEGATIVE   Ketones, ur NEGATIVE  NEGATIVE mg/dL   Specific Gravity, Urine 1.020  1.005 - 1.030   Hgb urine dipstick TRACE (*) NEGATIVE   pH 6.5  5.0 - 8.0   Protein, ur NEGATIVE  NEGATIVE mg/dL   Urobilinogen, UA 0.2  0.0 - 1.0 mg/dL   Nitrite NEGATIVE  NEGATIVE   Leukocytes, UA NEGATIVE  NEGATIVE    Course in Urgent Care Center:   She was given Toradol 60 mg IM and Depo-Medrol 80 mg IM and tolerated these both well without any immediate side effects.  Assessment:  The encounter diagnosis was Low back pain.  Since the pain is mostly midline, I doubt that this is muscle strain. A small likely is either arthritic pain or due to a bulging disc. There is no sign of a ruptured disc or lumbar radiculopathy. I recommended that she followup with Dr. Dion Saucier if no better in 2 weeks.  Plan:   1.  The following meds were prescribed:   New Prescriptions   DICLOFENAC (VOLTAREN) 75 MG EC TABLET  Take 1 tablet (75 mg total) by mouth 2 (two) times daily.   TRAMADOL (ULTRAM) 50 MG TABLET    Take 2 tablets (100 mg total) by mouth every 8 (eight) hours as needed for pain.   2.  The patient was instructed in symptomatic care and handouts were given. 3.  The patient was told to return if becoming worse in any way, if no better in 2 weeks, and given some red flag symptoms that would indicate earlier return. 4.  The patient was encouraged to try to be as active as possible and given some exercises to do  followed by moist heat.    Reuben Likes, MD 04/04/12 2056

## 2012-04-04 NOTE — ED Notes (Signed)
Reports back pain for three days.  Admits to injury to back years ago.  No urinary problems.   Medications taken but no relief.

## 2012-10-06 ENCOUNTER — Emergency Department (HOSPITAL_COMMUNITY)
Admission: EM | Admit: 2012-10-06 | Discharge: 2012-10-06 | Disposition: A | Discharge status: Home / Self Care | Payer: BC Managed Care – PPO | Source: Home / Self Care | Attending: Emergency Medicine | Admitting: Emergency Medicine

## 2012-10-06 ENCOUNTER — Encounter (HOSPITAL_COMMUNITY): Payer: Self-pay | Admitting: Emergency Medicine

## 2012-10-06 ENCOUNTER — Emergency Department (INDEPENDENT_AMBULATORY_CARE_PROVIDER_SITE_OTHER): Payer: BC Managed Care – PPO

## 2012-10-06 DIAGNOSIS — M549 Dorsalgia, unspecified: Secondary | ICD-10-CM

## 2012-10-06 DIAGNOSIS — M545 Low back pain: Secondary | ICD-10-CM

## 2012-10-06 LAB — POCT I-STAT, CHEM 8
Calcium, Ion: 1.19 mmol/L (ref 1.12–1.23)
Chloride: 102 mEq/L (ref 96–112)
Creatinine, Ser: 0.7 mg/dL (ref 0.50–1.10)
Glucose, Bld: 93 mg/dL (ref 70–99)
HCT: 39 % (ref 36.0–46.0)
Hemoglobin: 13.3 g/dL (ref 12.0–15.0)

## 2012-10-06 LAB — POCT URINALYSIS DIP (DEVICE)
Nitrite: NEGATIVE
Specific Gravity, Urine: 1.025 (ref 1.005–1.030)
Urobilinogen, UA: 0.2 mg/dL (ref 0.0–1.0)
pH: 6.5 (ref 5.0–8.0)

## 2012-10-06 MED ORDER — KETOROLAC TROMETHAMINE 60 MG/2ML IM SOLN
60.0000 mg | Freq: Once | INTRAMUSCULAR | Status: AC
  Administered 2012-10-06: 60 mg via INTRAMUSCULAR

## 2012-10-06 MED ORDER — KETOROLAC TROMETHAMINE 60 MG/2ML IM SOLN
INTRAMUSCULAR | Status: AC
  Filled 2012-10-06: qty 2

## 2012-10-06 MED ORDER — NAPROXEN 500 MG PO TABS: 500.0000 mg | ORAL_TABLET | Freq: Two times a day (BID) | ORAL | Status: DC

## 2012-10-06 MED ORDER — TRAMADOL HCL 50 MG PO TABS: 50.0000 mg | ORAL_TABLET | Freq: Four times a day (QID) | ORAL | Status: DC | PRN

## 2012-10-06 NOTE — ED Notes (Signed)
Pt given injection. Waiting discharge papers.

## 2012-10-06 NOTE — ED Provider Notes (Signed)
History    CSN: [id number] Arrival date & time 10/06/12  1118  First MD Initiated Contact with Patient 10/06/12 1226     Chief Complaint  Patient presents with  . Back Pain    x this a.m . pain in mid back that radiates down to lower back. denies urinary symptoms.    (Consider location/radiation/quality/duration/timing/severity/associated sxs/prior Treatment) HPI Comments: 37 year old female presents complaining of acute onset mid and lower back pain since she woke up this morning. She has a history of some chronic back pain but never anything this bad before the pain is across both sides of her back but is much worse on the right side. Pain is constant and is exacerbated by any movement. She states right now the pain is 6 of 10 in severity. She denies any dysuria, hematuria, injury, loss of bowel or bladder control, or history of kidney stones. She has a history of fibromyalgia and usually takes tramadol for pain but she did not take this because she has run out.   Patient is a 37 y.o. female presenting with back pain.  Back Pain Associated symptoms: no abdominal pain, no chest pain, no dysuria, no fever and no weakness    Past Medical History  Diagnosis Date  . Depression     15 years  . Fibromyalgia     2 years  . Hyperlipemia   . Obesity     12 years   Past Surgical History  Procedure Laterality Date  . Abdominal hysterectomy    . Hernia repair    . Cesarean section    . Tubal ligation     Family History  Problem Relation Age of Onset  . Ulcerative colitis      [employer]  . Cancer Father     Nasal/ Pharnyx   History  Substance Use Topics  . Smoking status: Former Games developer  . Smokeless tobacco: Never Used  . Alcohol Use: Yes     Comment: occasionally   OB History   Grav Para Term Preterm Abortions TAB SAB Ect Mult Living                 Review of Systems  Constitutional: Negative for fever and chills.  Eyes: Negative for visual disturbance.  Respiratory:  Negative for cough and shortness of breath.   Cardiovascular: Negative for chest pain, palpitations and leg swelling.  Gastrointestinal: Negative for nausea, vomiting and abdominal pain.  Endocrine: Negative for polydipsia and polyuria.  Genitourinary: Negative for dysuria, urgency and frequency.  Musculoskeletal: Positive for myalgias, back pain and arthralgias.  Skin: Negative for rash.  Neurological: Negative for dizziness, weakness and light-headedness.    Allergies  Review of patient's allergies indicates no known allergies.  Home Medications   Current Outpatient Rx  Name  Route  Sig  Dispense  Refill  . traMADol (ULTRAM) 50 MG tablet   Oral   Take 2 tablets (100 mg total) by mouth every 8 (eight) hours as needed for pain.   30 tablet   0   . cyclobenzaprine (FLEXERIL) 10 MG tablet   Oral   Take 10 mg by mouth 3 (three) times daily as needed.         . diclofenac (VOLTAREN) 75 MG EC tablet   Oral   Take 1 tablet (75 mg total) by mouth 2 (two) times daily.   20 tablet   0   . Hyoscyamine Sulfate (HYOMAX-DT) 0.375 MG TBCR   Oral   Take  1 tablet (0.375 mg total) by mouth 2 (two) times daily.   60 each   0   . naproxen (NAPROSYN) 500 MG tablet   Oral   Take 1 tablet (500 mg total) by mouth 2 (two) times daily with a meal. For one week then as needed   60 tablet   1   . naproxen (NAPROSYN) 500 MG tablet   Oral   Take 1 tablet (500 mg total) by mouth 2 (two) times daily.   60 tablet   0   . traMADol (ULTRAM) 50 MG tablet   Oral   Take 1 tablet (50 mg total) by mouth every 6 (six) hours as needed for pain.   30 tablet   0    There were no vitals taken for this visit. Physical Exam  Nursing note and vitals reviewed. Constitutional: She is oriented to person, place, and time. Vital signs are normal. She appears well-developed and well-nourished. No distress.  HENT:  Head: Atraumatic.  Eyes: EOM are normal. Pupils are equal, round, and reactive to light.   Cardiovascular: Normal rate, regular rhythm and normal heart sounds.  Exam reveals no gallop and no friction rub.   No murmur heard. Pulmonary/Chest: Effort normal and breath sounds normal. No respiratory distress. She has no wheezes. She has no rales.  Abdominal: Soft. There is no tenderness. There is CVA tenderness (right-sided).  Musculoskeletal:       Thoracic back: She exhibits tenderness (Paraspinous musculature, worse on the right) and pain. She exhibits no bony tenderness.       Lumbar back: She exhibits decreased range of motion, tenderness, bony tenderness (mild) and pain.  Neurological: She is alert and oriented to person, place, and time. She has normal strength.  Skin: Skin is warm and dry. She is not diaphoretic.  Psychiatric: She has a normal mood and affect. Her behavior is normal. Judgment normal.    ED Course  Procedures (including critical care time) Labs Reviewed  POCT URINALYSIS DIP (DEVICE) - Abnormal; Notable for the following:    Hgb urine dipstick TRACE (*)    All other components within normal limits  POCT I-STAT, CHEM 8   Dg Abd 1 View  10/06/2012   *RADIOLOGY REPORT*  Clinical Data: Back pain.  ABDOMEN - 1 VIEW  Comparison: None.  Findings: The bowel, soft tissue planes and bony structures are normal.  No ileus or  abnormal calcifications.  IMPRESSION: Normal abdomen.   Original Report Authenticated By: Sander Radon, M.D.   1. Back pain     MDM  Treat this as musculoskeletal back pain. Advised to stay active and to use moist heat if it helps the pain. She will return in a few days if the pain is not resolving. It is possible that this pain could be caused by any stones but I think this is unlikely given her presentation.     Meds ordered this encounter  Medications  . ketorolac (TORADOL) injection 60 mg    Sig:   . naproxen (NAPROSYN) 500 MG tablet    Sig: Take 1 tablet (500 mg total) by mouth 2 (two) times daily.    Dispense:  60 tablet     Refill:  0  . traMADol (ULTRAM) 50 MG tablet    Sig: Take 1 tablet (50 mg total) by mouth every 6 (six) hours as needed for pain.    Dispense:  30 tablet    Refill:  0     Adrian Blackwater  Lavell Supple, PA-C 10/06/12 1341

## 2012-10-06 NOTE — ED Notes (Signed)
Reports back pain x this a.m that starts in the middle of back and radiates down to lower back. Pt denies urinary symptoms and injury.  Pt has used pain med and tiger balm with no relief.

## 2012-10-07 NOTE — ED Provider Notes (Signed)
Medical screening examination/treatment/procedure(s) were performed by non-physician practitioner and as supervising physician I was immediately available for consultation/collaboration.  Leslee Home, M.D.  Reuben Likes, MD 10/07/12 2153

## 2012-12-24 ENCOUNTER — Ambulatory Visit: Payer: BC Managed Care – PPO | Admitting: Physician Assistant

## 2012-12-24 VITALS — BP 128/80 | HR 82 | Temp 98.3°F | Resp 17 | Ht 60.0 in | Wt 254.0 lb

## 2012-12-24 DIAGNOSIS — R21 Rash and other nonspecific skin eruption: Secondary | ICD-10-CM

## 2012-12-24 DIAGNOSIS — R059 Cough, unspecified: Secondary | ICD-10-CM

## 2012-12-24 DIAGNOSIS — J209 Acute bronchitis, unspecified: Secondary | ICD-10-CM

## 2012-12-24 DIAGNOSIS — R05 Cough: Secondary | ICD-10-CM

## 2012-12-24 DIAGNOSIS — R0981 Nasal congestion: Secondary | ICD-10-CM

## 2012-12-24 DIAGNOSIS — J3489 Other specified disorders of nose and nasal sinuses: Secondary | ICD-10-CM

## 2012-12-24 MED ORDER — CLOBETASOL PROPIONATE 0.05 % EX CREA: TOPICAL_CREAM | Freq: Two times a day (BID) | CUTANEOUS | Status: DC

## 2012-12-24 MED ORDER — AZITHROMYCIN 250 MG PO TABS: ORAL_TABLET | ORAL | Status: DC

## 2012-12-24 MED ORDER — METHYLPREDNISOLONE (PAK) 4 MG PO TABS: ORAL_TABLET | ORAL | Status: DC

## 2012-12-24 MED ORDER — IPRATROPIUM BROMIDE 0.03 % NA SOLN: 2.0000 | Freq: Two times a day (BID) | NASAL | Status: DC

## 2012-12-24 MED ORDER — HYDROCODONE-HOMATROPINE 5-1.5 MG/5ML PO SYRP: 5.0000 mL | ORAL_SOLUTION | Freq: Three times a day (TID) | ORAL | Status: DC | PRN

## 2012-12-24 NOTE — Progress Notes (Signed)
  Subjective:    Patient ID: Ana Wolfe, female    DOB: Mar 18, 1976, 37 y.o.   MRN: [id number]  HPI  37 year old female presents for 2 week history of nasal congestion, cough, sinus pressure, ear fullness, SOB, and chest tightness.  States symptoms started acutely and have progressively worsened.  Has had dry, hacking cough that is nonproductive but does cause chest pains.  Complains of PND and nasal congestion. She has been taking Robitussin which does not seem to be helping.  Denies fever, chills, hemoptysis, palpitations, dizziness, nausea, vomiting, or headache.   Also complains of rash on left antecubital fossa x 1 month. Describes as pruritic. Does not seem to be spreading or changing. She has been using her son's TAC cream daily which does not seem to be helping.   Patient is otherwise doing well with no other concerns today.  No hx of DM.     Review of Systems  Constitutional: Negative for fever and chills.  HENT: Positive for congestion, rhinorrhea and postnasal drip. Negative for ear pain (bilateral ear fullness), sore throat and sneezing.   Respiratory: Positive for cough and shortness of breath. Negative for wheezing.   Cardiovascular: Positive for chest pain (while coughing).  Gastrointestinal: Negative for nausea, vomiting and abdominal pain.  Neurological: Negative for dizziness and headaches.       Objective:   Physical Exam  Constitutional: She is oriented to person, place, and time. She appears well-developed and well-nourished.  HENT:  Head: Normocephalic and atraumatic.  Right Ear: Hearing, tympanic membrane, external ear and ear canal normal.  Left Ear: Hearing, tympanic membrane, external ear and ear canal normal.  Nose: Right sinus exhibits no maxillary sinus tenderness and no frontal sinus tenderness. Left sinus exhibits no maxillary sinus tenderness and no frontal sinus tenderness.  Mouth/Throat: Uvula is midline, oropharynx is clear and moist and mucous  membranes are normal.  Eyes: Conjunctivae are normal.  Neck: Normal range of motion. Neck supple.  Cardiovascular: Normal rate, regular rhythm and normal heart sounds.   Pulmonary/Chest: Effort normal and breath sounds normal.  Lymphadenopathy:    She has no cervical adenopathy.  Neurological: She is alert and oriented to person, place, and time.  Skin:     Psychiatric: She has a normal mood and affect. Her behavior is normal. Judgment and thought content normal.          Assessment & Plan:  Nasal congestion - Plan: ipratropium (ATROVENT) 0.03 % nasal spray  Cough - Plan: methylPREDNIsolone (MEDROL DOSPACK) 4 MG tablet, HYDROcodone-homatropine (HYCODAN) 5-1.5 MG/5ML syrup  Acute bronchitis - Plan: azithromycin (ZITHROMAX) 250 MG tablet  Rash and nonspecific skin eruption - Plan: clobetasol cream (TEMOVATE) 0.05 %  Will go ahead and treat with Zpack as directed Medrol dose pack Atrovent NS twice daily to help with congestion and PND Hycodan qhs prn cough Increase fluids and rest Trial of clobetasol cream to left antecubital fossa - if no improvement in 1-2 weeks recommend referral to [facility]

## 2013-01-08 ENCOUNTER — Ambulatory Visit: Payer: BC Managed Care – PPO | Admitting: Family Medicine

## 2013-01-08 VITALS — BP 118/64 | HR 86 | Temp 98.0°F | Resp 16 | Ht 59.5 in | Wt 255.0 lb

## 2013-01-08 DIAGNOSIS — J309 Allergic rhinitis, unspecified: Secondary | ICD-10-CM

## 2013-01-08 DIAGNOSIS — J0100 Acute maxillary sinusitis, unspecified: Secondary | ICD-10-CM

## 2013-01-08 DIAGNOSIS — R05 Cough: Secondary | ICD-10-CM

## 2013-01-08 DIAGNOSIS — R5381 Other malaise: Secondary | ICD-10-CM

## 2013-01-08 MED ORDER — AMOXICILLIN-POT CLAVULANATE 875-125 MG PO TABS: 1.0000 | ORAL_TABLET | Freq: Two times a day (BID) | ORAL | Status: DC

## 2013-01-08 NOTE — Patient Instructions (Addendum)
Allegra or Zyrtec once daily for allergies, use the  Antibiotic as directed, take all of the antibiotic. If you get worse or you do not improve, return to clinic. If your cough gets worse, or not improving in next 10 days,  return to clinic for the chest xray. Use netti pot for sinuses, or saline nasal spray at least 3-4 times per day. Return to the clinic or go to the nearest emergency room if any of your symptoms worsen or new symptoms occur.    Sinusitis Sinusitis is redness, soreness, and swelling (inflammation) of the paranasal sinuses. Paranasal sinuses are air pockets within the bones of your face (beneath the eyes, the middle of the forehead, or above the eyes). In healthy paranasal sinuses, mucus is able to drain out, and air is able to circulate through them by way of your nose. However, when your paranasal sinuses are inflamed, mucus and air can become trapped. This can allow bacteria and other germs to grow and cause infection. Sinusitis can develop quickly and last only a short time (acute) or continue over a long period (chronic). Sinusitis that lasts for more than 12 weeks is considered chronic.  CAUSES  Causes of sinusitis include:  Allergies.  Structural abnormalities, such as displacement of the cartilage that separates your nostrils (deviated septum), which can decrease the air flow through your nose and sinuses and affect sinus drainage.  Functional abnormalities, such as when the small hairs (cilia) that line your sinuses and help remove mucus do not work properly or are not present. SYMPTOMS  Symptoms of acute and chronic sinusitis are the same. The primary symptoms are pain and pressure around the affected sinuses. Other symptoms include:  Upper toothache.  Earache.  Headache.  Bad breath.  Decreased sense of smell and taste.  A cough, which worsens when you are lying flat.  Fatigue.  Fever.  Thick drainage from your nose, which often is green and may contain  pus (purulent).  Swelling and warmth over the affected sinuses. DIAGNOSIS  Your caregiver will perform a physical exam. During the exam, your caregiver may:  Look in your nose for signs of abnormal growths in your nostrils (nasal polyps).  Tap over the affected sinus to check for signs of infection.  View the inside of your sinuses (endoscopy) with a special imaging device with a light attached (endoscope), which is inserted into your sinuses. If your caregiver suspects that you have chronic sinusitis, one or more of the following tests may be recommended:  Allergy tests.  Nasal culture A sample of mucus is taken from your nose and sent to a lab and screened for bacteria.  Nasal cytology A sample of mucus is taken from your nose and examined by your caregiver to determine if your sinusitis is related to an allergy. TREATMENT  Most cases of acute sinusitis are related to a viral infection and will resolve on their own within 10 days. Sometimes medicines are prescribed to help relieve symptoms (pain medicine, decongestants, nasal steroid sprays, or saline sprays).  However, for sinusitis related to a bacterial infection, your caregiver will prescribe antibiotic medicines. These are medicines that will help kill the bacteria causing the infection.  Rarely, sinusitis is caused by a fungal infection. In theses cases, your caregiver will prescribe antifungal medicine. For some cases of chronic sinusitis, surgery is needed. Generally, these are cases in which sinusitis recurs more than 3 times per year, despite other treatments. HOME CARE INSTRUCTIONS   Drink plenty of  water. Water helps thin the mucus so your sinuses can drain more easily.  Use a humidifier.  Inhale steam 3 to 4 times a day (for example, sit in the bathroom with the shower running).  Apply a warm, moist washcloth to your face 3 to 4 times a day, or as directed by your caregiver.  Use saline nasal sprays to help moisten and  clean your sinuses.  Take over-the-counter or prescription medicines for pain, discomfort, or fever only as directed by your caregiver. SEEK IMMEDIATE MEDICAL CARE IF:  You have increasing pain or severe headaches.  You have nausea, vomiting, or drowsiness.  You have swelling around your face.  You have vision problems.  You have a stiff neck.  You have difficulty breathing. MAKE SURE YOU:   Understand these instructions.  Will watch your condition.  Will get help right away if you are not doing well or get worse. Document Released: 03/27/2005 Document Revised: 06/19/2011 Document Reviewed: 04/11/2011 [facility] Patient Information 2014 [place], [place].  this rinses the sinuses out, you can get this at the drug store.          Allergic Rhinitis Allergic rhinitis is when the mucous membranes in the nose respond to allergens. Allergens are particles in the air that cause your body to have an allergic reaction. This causes you to release allergic antibodies. Through a chain of events, these eventually cause you to release histamine into the blood stream (hence the use of antihistamines). Although meant to be protective to the body, it is this release that causes your discomfort, such as frequent sneezing, congestion and an itchy runny nose.  CAUSES  The pollen allergens may come from grasses, trees, and weeds. This is seasonal allergic rhinitis, or "hay fever." Other allergens cause year-round allergic rhinitis (perennial allergic rhinitis) such as house dust mite allergen, pet dander and mold spores.  SYMPTOMS   Nasal stuffiness (congestion).  Runny, itchy nose with sneezing and tearing of the eyes.  There is often an itching of the mouth, eyes and ears. It cannot be cured, but it can be controlled with medications. DIAGNOSIS  If you are unable to determine the offending allergen, skin or blood testing may find it. TREATMENT   Avoid the allergen.  Medications and  allergy shots (immunotherapy) can help.  Hay fever may often be treated with antihistamines in pill or nasal spray forms. Antihistamines block the effects of histamine. There are over-the-counter medicines that may help with nasal congestion and swelling around the eyes. Check with your caregiver before taking or giving this medicine. If the treatment above does not work, there are many new medications your caregiver can prescribe. Stronger medications may be used if initial measures are ineffective. Desensitizing injections can be used if medications and avoidance fails. Desensitization is when a patient is given ongoing shots until the body becomes less sensitive to the allergen. Make sure you follow up with your caregiver if problems continue. SEEK MEDICAL CARE IF:   You develop fever (more than 100.5 F (38.1 C).  You develop a cough that does not stop easily (persistent).  You have shortness of breath.  You start wheezing.  Symptoms interfere with normal daily activities. Document Released: 12/20/2000 Document Revised: 06/19/2011 Document Reviewed: 07/01/2008 [facility] Patient Information 2014 [place], [place].

## 2013-01-08 NOTE — Progress Notes (Signed)
Subjective:    Patient ID: Ana Wolfe, female    DOB: 1975-08-14, 37 y.o.   MRN: [id number]  HPI Ana Wolfe is a 37 y.o. female  Started 3 weeks ago with congestion, runny nose.  No fever. Cough - persisted, but no worsening, not short of breath. More congestion, noticed ears feel blocked. Not able to blow out congestion. Has had some seasonal allergies, otc supplement. Cough with PND, more in evening.  Takes otc homeopathic allergy relief. Dry cough.  No wheezing. Denies heartburn/belching recently. No hx of lung dz.   Tx: robitussin, and seen here on 9/16- treated with prednisone - dose pak, finished last week, ZPak, atrovent ns BID. No other otc treatments for allergies. No relief with prior treatments and made nauseated.   SH: quit smoking in around 2000 - for few years only.   Past Medical History  Diagnosis Date  . Depression     15 years  . Fibromyalgia     2 years  . Hyperlipemia   . Obesity     12 years   Past Surgical History  Procedure Laterality Date  . Abdominal hysterectomy    . Hernia repair    . Cesarean section    . Tubal ligation     No Known Allergies Prior to Admission medications   Medication Sig Start Date End Date Taking? Authorizing Provider  clobetasol cream (TEMOVATE) 0.05 % Apply topically 2 (two) times daily. 12/24/12  Yes Heather M Marte, PA-C  cyclobenzaprine (FLEXERIL) 10 MG tablet Take 10 mg by mouth 3 (three) times daily as needed.   Yes Historical Provider, MD  HYDROcodone-homatropine (HYCODAN) 5-1.5 MG/5ML syrup Take 5 mLs by mouth every 8 (eight) hours as needed for cough. 12/24/12  Yes Heather M Marte, PA-C  ipratropium (ATROVENT) 0.03 % nasal spray Place 2 sprays into the nose 2 (two) times daily. 12/24/12  Yes Heather M Marte, PA-C  naproxen (NAPROSYN) 500 MG tablet Take 1 tablet (500 mg total) by mouth 2 (two) times daily with a meal. For one week then as needed 02/24/12  Yes Johnsie Kindred, NP  naproxen (NAPROSYN) 500 MG tablet  Take 1 tablet (500 mg total) by mouth 2 (two) times daily. 10/06/12  Yes Graylon Good, PA-C  traMADol (ULTRAM) 50 MG tablet Take 2 tablets (100 mg total) by mouth every 8 (eight) hours as needed for pain. 04/04/12  Yes Reuben Likes, MD  traMADol (ULTRAM) 50 MG tablet Take 1 tablet (50 mg total) by mouth every 6 (six) hours as needed for pain. 10/06/12  Yes Graylon Good, PA-C  azithromycin (ZITHROMAX) 250 MG tablet Take 2 tabs PO x 1 dose, then 1 tab PO QD x 4 days 12/24/12   Nelva Nay, PA-C  methylPREDNIsolone (MEDROL DOSPACK) 4 MG tablet follow package directions 12/24/12   Nelva Nay, PA-C   History   Social History  . Marital Status: Married    Spouse Name: N/A    Number of Children: 2  . Years of Education: N/A   Occupational History  . Homemaker    Social History Main Topics  . Smoking status: Former Games developer  . Smokeless tobacco: Never Used  . Alcohol Use: Yes     Comment: occasionally  . Drug Use: No  . Sexual Activity: Not on file   Other Topics Concern  . Not on file   Social History Narrative  . No narrative on file     Review of  Systems  Constitutional: Positive for fatigue. Negative for fever and chills.  HENT: Positive for congestion and postnasal drip.   Respiratory: Positive for cough. Negative for shortness of breath.   Cardiovascular: Negative for chest pain.  Gastrointestinal: Positive for abdominal pain.  Skin: Negative for rash.       Objective:   Physical Exam  Vitals reviewed. Constitutional: She is oriented to person, place, and time. She appears well-developed and well-nourished. No distress.  HENT:  Head: Normocephalic and atraumatic.  Right Ear: Hearing, tympanic membrane, external ear and ear canal normal.  Left Ear: Hearing, tympanic membrane, external ear and ear canal normal.  Nose: Right sinus exhibits maxillary sinus tenderness. Left sinus exhibits maxillary sinus tenderness.  Mouth/Throat: Oropharynx is clear and moist.  No oropharyngeal exudate.  Eyes: Conjunctivae and EOM are normal. Pupils are equal, round, and reactive to light.  Cardiovascular: Normal rate, regular rhythm, normal heart sounds and intact distal pulses.   No murmur heard. Pulmonary/Chest: Effort normal and breath sounds normal. No respiratory distress. She has no wheezes. She has no rhonchi.  Neurological: She is alert and oriented to person, place, and time.  Skin: Skin is warm and dry. No rash noted.  Psychiatric: She has a normal mood and affect. Her behavior is normal.        Assessment & Plan:  Ana Wolfe is a 37 y.o. female Allergic rhinitis  Cough  Other malaise and fatigue  Sinusitis, acute maxillary - Plan: amoxicillin-clavulanate (AUGMENTIN) 875-125 MG per tablet  Suspected allergic rhinitis and maxillary sinusitis, unrelieved with prednisone and Zpak.  Vitals and exam reassuring. Suspected PND causing cough, but discussed XR with persistent cough. Will try Augmentin for sinus coverage, saline ns, Zyrtec or Allegra, and other sx care as below. rtc precautions, including to rtc ofr CXR if not improving.    Meds ordered this encounter  Medications  . amoxicillin-clavulanate (AUGMENTIN) 875-125 MG per tablet    Sig: Take 1 tablet by mouth 2 (two) times daily.    Dispense:  20 tablet    Refill:  0   Patient Instructions  Allegra or Zyrtec once daily for allergies, use the  Antibiotic as directed, take all of the antibiotic. If you get worse or you do not improve, return to clinic. If your cough gets worse, or not improving in next 10 days,  return to clinic for the chest xray. Use netti pot for sinuses, or saline nasal spray at least 3-4 times per day. Return to the clinic or go to the nearest emergency room if any of your symptoms worsen or new symptoms occur.    Sinusitis Sinusitis is redness, soreness, and swelling (inflammation) of the paranasal sinuses. Paranasal sinuses are air pockets within the bones of your  face (beneath the eyes, the middle of the forehead, or above the eyes). In healthy paranasal sinuses, mucus is able to drain out, and air is able to circulate through them by way of your nose. However, when your paranasal sinuses are inflamed, mucus and air can become trapped. This can allow bacteria and other germs to grow and cause infection. Sinusitis can develop quickly and last only a short time (acute) or continue over a long period (chronic). Sinusitis that lasts for more than 12 weeks is considered chronic.  CAUSES  Causes of sinusitis include:  Allergies.  Structural abnormalities, such as displacement of the cartilage that separates your nostrils (deviated septum), which can decrease the air flow through your nose and sinuses and  affect sinus drainage.  Functional abnormalities, such as when the small hairs (cilia) that line your sinuses and help remove mucus do not work properly or are not present. SYMPTOMS  Symptoms of acute and chronic sinusitis are the same. The primary symptoms are pain and pressure around the affected sinuses. Other symptoms include:  Upper toothache.  Earache.  Headache.  Bad breath.  Decreased sense of smell and taste.  A cough, which worsens when you are lying flat.  Fatigue.  Fever.  Thick drainage from your nose, which often is green and may contain pus (purulent).  Swelling and warmth over the affected sinuses. DIAGNOSIS  Your caregiver will perform a physical exam. During the exam, your caregiver may:  Look in your nose for signs of abnormal growths in your nostrils (nasal polyps).  Tap over the affected sinus to check for signs of infection.  View the inside of your sinuses (endoscopy) with a special imaging device with a light attached (endoscope), which is inserted into your sinuses. If your caregiver suspects that you have chronic sinusitis, one or more of the following tests may be recommended:  Allergy tests.  Nasal culture A  sample of mucus is taken from your nose and sent to a lab and screened for bacteria.  Nasal cytology A sample of mucus is taken from your nose and examined by your caregiver to determine if your sinusitis is related to an allergy. TREATMENT  Most cases of acute sinusitis are related to a viral infection and will resolve on their own within 10 days. Sometimes medicines are prescribed to help relieve symptoms (pain medicine, decongestants, nasal steroid sprays, or saline sprays).  However, for sinusitis related to a bacterial infection, your caregiver will prescribe antibiotic medicines. These are medicines that will help kill the bacteria causing the infection.  Rarely, sinusitis is caused by a fungal infection. In theses cases, your caregiver will prescribe antifungal medicine. For some cases of chronic sinusitis, surgery is needed. Generally, these are cases in which sinusitis recurs more than 3 times per year, despite other treatments. HOME CARE INSTRUCTIONS   Drink plenty of water. Water helps thin the mucus so your sinuses can drain more easily.  Use a humidifier.  Inhale steam 3 to 4 times a day (for example, sit in the bathroom with the shower running).  Apply a warm, moist washcloth to your face 3 to 4 times a day, or as directed by your caregiver.  Use saline nasal sprays to help moisten and clean your sinuses.  Take over-the-counter or prescription medicines for pain, discomfort, or fever only as directed by your caregiver. SEEK IMMEDIATE MEDICAL CARE IF:  You have increasing pain or severe headaches.  You have nausea, vomiting, or drowsiness.  You have swelling around your face.  You have vision problems.  You have a stiff neck.  You have difficulty breathing. MAKE SURE YOU:   Understand these instructions.  Will watch your condition.  Will get help right away if you are not doing well or get worse. Document Released: 03/27/2005 Document Revised: 06/19/2011 Document  Reviewed: 04/11/2011 [facility] Patient Information 2014 [place], [place].  this rinses the sinuses out, you can get this at the drug store.          Allergic Rhinitis Allergic rhinitis is when the mucous membranes in the nose respond to allergens. Allergens are particles in the air that cause your body to have an allergic reaction. This causes you to release allergic antibodies. Through a chain of  events, these eventually cause you to release histamine into the blood stream (hence the use of antihistamines). Although meant to be protective to the body, it is this release that causes your discomfort, such as frequent sneezing, congestion and an itchy runny nose.  CAUSES  The pollen allergens may come from grasses, trees, and weeds. This is seasonal allergic rhinitis, or "hay fever." Other allergens cause year-round allergic rhinitis (perennial allergic rhinitis) such as house dust mite allergen, pet dander and mold spores.  SYMPTOMS   Nasal stuffiness (congestion).  Runny, itchy nose with sneezing and tearing of the eyes.  There is often an itching of the mouth, eyes and ears. It cannot be cured, but it can be controlled with medications. DIAGNOSIS  If you are unable to determine the offending allergen, skin or blood testing may find it. TREATMENT   Avoid the allergen.  Medications and allergy shots (immunotherapy) can help.  Hay fever may often be treated with antihistamines in pill or nasal spray forms. Antihistamines block the effects of histamine. There are over-the-counter medicines that may help with nasal congestion and swelling around the eyes. Check with your caregiver before taking or giving this medicine. If the treatment above does not work, there are many new medications your caregiver can prescribe. Stronger medications may be used if initial measures are ineffective. Desensitizing injections can be used if medications and avoidance fails. Desensitization is when a patient  is given ongoing shots until the body becomes less sensitive to the allergen. Make sure you follow up with your caregiver if problems continue. SEEK MEDICAL CARE IF:   You develop fever (more than 100.5 F (38.1 C).  You develop a cough that does not stop easily (persistent).  You have shortness of breath.  You start wheezing.  Symptoms interfere with normal daily activities. Document Released: 12/20/2000 Document Revised: 06/19/2011 Document Reviewed: 07/01/2008 [facility] Patient Information 2014 [place], [place].

## 2013-05-22 ENCOUNTER — Ambulatory Visit: Payer: BC Managed Care – PPO | Admitting: Family Medicine

## 2013-05-22 ENCOUNTER — Ambulatory Visit: Payer: BC Managed Care – PPO

## 2013-05-22 VITALS — BP 131/88 | HR 77 | Temp 98.7°F | Resp 17 | Wt 255.0 lb

## 2013-05-22 DIAGNOSIS — T148XXA Other injury of unspecified body region, initial encounter: Secondary | ICD-10-CM

## 2013-05-22 DIAGNOSIS — M25579 Pain in unspecified ankle and joints of unspecified foot: Secondary | ICD-10-CM

## 2013-05-22 NOTE — Progress Notes (Signed)
Chief Complaint:  Chief Complaint  Patient presents with  . Foot Pain    left    HPI: Ana Wolfe is a 38 y.o. female who is here for  2 month hisotry of left medial ankle pain, NKI, just worsening sxs, more difficulty walking  She has sharp pain, with weight bearing, 6/10 pain.  She has tried wearing an OTC  foot brace with her sneakers and some pain cream but it did not help.  No prior ankle sprains or broken bones.  No fevers or chills. 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 Depression     15 years  . Fibromyalgia     2 years  . Hyperlipemia   . Obesity     12 years   Past Surgical History  Procedure Laterality Date  . Abdominal hysterectomy    . Hernia repair    . Cesarean section    . Tubal ligation     History   Social History  . Marital Status: Married    Spouse Name: N/A    Number of Children: 2  . Years of Education: N/A   Occupational History  . Homemaker    Social History Main Topics  . Smoking status: Former Games developer  . Smokeless tobacco: Never Used  . Alcohol Use: Yes     Comment: occasionally  . Drug Use: No  . Sexual Activity: None   Other Topics Concern  . None   Social History Narrative  . None   Family History  Problem Relation Age of Onset  . Ulcerative colitis      [employer]  . Cancer Father     Nasal/ Pharnyx   Allergies  Allergen Reactions  . Cymbalta [Duloxetine Hcl]    Prior to Admission medications   Medication Sig Start Date End Date Taking? Authorizing Provider  cyclobenzaprine (FLEXERIL) 10 MG tablet Take 10 mg by mouth 3 (three) times daily as needed.   Yes Historical Provider, MD  diclofenac (VOLTAREN) 50 MG EC tablet Take 50 mg by mouth 2 (two) times daily.   Yes  Historical Provider, MD  naproxen (NAPROSYN) 500 MG tablet Take 1 tablet (500 mg total) by mouth 2 (two) times daily with a meal. For one week then as needed 02/24/12  Yes Johnsie Kindred, NP  traMADol (ULTRAM) 50 MG tablet Take 2 tablets (100 mg total) by mouth every 8 (eight) hours as needed for pain. 04/04/12  Yes Reuben Likes, MD     ROS: The patient denies fevers, chills, night sweats, unintentional weight loss, chest pain, palpitations, wheezing, dyspnea on exertion, nausea, vomiting, abdominal pain, dysuria, hematuria, melena, numbness, weakness, or tingling.   All other systems have been reviewed and were otherwise negative with the exception of those mentioned in the HPI and as above.    PHYSICAL EXAM: Filed Vitals:   05/22/13 1738  BP: 131/88  Pulse: 77  Temp: 98.7 F (37.1 C)  Resp: 17   Filed Vitals:   05/22/13 1738  Weight: 255 lb (115.667 kg)   Body mass index is 50.66 kg/(m^2).  General: Alert, no acute distress HEENT:  Normocephalic, atraumatic, oropharynx patent. EOMI,  PERRLA Cardiovascular:  Regular rate and rhythm, no rubs murmurs or gallops.  No Carotid bruits, radial pulse intact. No pedal edema.  Respiratory: Clear to auscultation bilaterally.  No wheezes, rales, or rhonchi.  No cyanosis, no use of accessory musculature GI: No organomegaly, abdomen is soft and non-tender, positive bowel sounds.  No masses. Skin: No rashes. Neurologic: Facial musculature symmetric. Psychiatric: Patient is appropriate throughout our interaction. Lymphatic: No cervical lymphadenopathy Musculoskeletal: Gait intact. Left ankle -post tib tendon tenderness, pain with eversion 5/5 strength, sensation intact, + minimal soft tissue swelling around medail malleoli + DP   LABS: Results for orders placed during the hospital encounter of 10/06/12  POCT URINALYSIS DIP (DEVICE)      Result Value Ref Range   Glucose, UA NEGATIVE  NEGATIVE mg/dL   Bilirubin Urine NEGATIVE  NEGATIVE    Ketones, ur NEGATIVE  NEGATIVE mg/dL   Specific Gravity, Urine 1.025  1.005 - 1.030   Hgb urine dipstick TRACE (*) NEGATIVE   pH 6.5  5.0 - 8.0   Protein, ur NEGATIVE  NEGATIVE mg/dL   Urobilinogen, UA 0.2  0.0 - 1.0 mg/dL   Nitrite NEGATIVE  NEGATIVE   Leukocytes, UA NEGATIVE  NEGATIVE  POCT I-STAT, CHEM 8      Result Value Ref Range   Sodium 140  135 - 145 mEq/L   Potassium 3.8  3.5 - 5.1 mEq/L   Chloride 102  96 - 112 mEq/L   BUN 9  6 - 23 mg/dL   Creatinine, Ser 1.610.70  0.50 - 1.10 mg/dL   Glucose, Bld 93  70 - 99 mg/dL   Calcium, Ion 0.961.19  0.451.12 - 1.23 mmol/L   TCO2 26  0 - 100 mmol/L   Hemoglobin 13.3  12.0 - 15.0 g/dL   HCT 40.939.0  81.136.0 - 91.446.0 %     EKG/XRAY:   [facility]. No fx/dislocation   ASSESSMENT/PLAN: Encounter Diagnoses  Name Primary?  . Pain in joint, ankle and foot Yes  . Sprain and strain    Sprain/strain of tibialis tendon with involvement of the ext hallixus brevis/longus tendons.  [facility], she felt better after walking with Sweedo ankle brace Cont with naproxen that she already has, she also has tramadol which she uses for pain prn ROM exercises given F/u prn  Gross sideeffects, risk and benefits, and alternatives of medications d/w patient. Patient is aware that all medications have potential sideeffects and we are unable to predict every sideeffect or drug-drug interaction that may occur.  Rockne CoonsLE, Marshal Eskew [name], DO 05/22/2013 6:47 PM

## 2013-05-22 NOTE — Patient Instructions (Signed)
Acute Ankle Sprain  with Phase I Rehab  An acute ankle sprain is a partial or complete tear in one or more of the ligaments of the ankle due to traumatic injury. The severity of the injury depends on both the the number of ligaments sprained and the grade of sprain. There are 3 grades of sprains.   · A grade 1 sprain is a mild sprain. There is a slight pull without obvious tearing. There is no loss of strength, and the muscle and ligament are the correct length.  · A grade 2 sprain is a moderate sprain. There is tearing of fibers within the substance of the ligament where it connects two bones or two cartilages. The length of the ligament is increased, and there is usually decreased strength.  · A grade 3 sprain is a complete rupture of the ligament and is uncommon.  In addition to the grade of sprain, there are three types of ankle sprains.   Lateral ankle sprains: This is a sprain of one or more of the three ligaments on the outer side (lateral) of the ankle. These are the most common sprains.  Medial ankle sprains: There is one large triangular ligament of the inner side (medial) of the ankle that is susceptible to injury. Medial ankle sprains are less common.  Syndesmosis, "high ankle," sprains: The syndesmosis is the ligament that connects the two bones of the lower leg. Syndesmosis sprains usually only occur with very severe ankle sprains.  SYMPTOMS  · Pain, tenderness, and swelling in the ankle, starting at the side of injury that may progress to the whole ankle and foot with time.  · "Pop" or tearing sensation at the time of injury.  · Bruising that may spread to the heel.  · Impaired ability to walk soon after injury.  CAUSES   · Acute ankle sprains are caused by trauma placed on the ankle that temporarily forces or pries the anklebone (talus) out of its normal socket.  · Stretching or tearing of the ligaments that normally hold the joint in place (usually due to a twisting injury).  RISK INCREASES  WITH:  · Previous ankle sprain.  · Sports in which the foot may land awkwardly (ie. basketball, volleyball, or soccer) or walking or running on uneven or rough surfaces.  · Shoes with inadequate support to prevent sideways motion when stress occurs.  · Poor strength and flexibility.  · Poor balance skills.  · Contact sports.  PREVENTION   · Warm up and stretch properly before activity.  · Maintain physical fitness:  · Ankle and leg flexibility, muscle strength, and endurance.  · Cardiovascular fitness.  · Balance training activities.  · Use proper technique and have a coach correct improper technique.  · Taping, protective strapping, bracing, or high-top tennis shoes may help prevent injury. Initially, tape is best; however, it loses most of its support function within 10 to 15 minutes.  · Wear proper fitted protective shoes (High-top shoes with taping or bracing is more effective than either alone).  · Provide the ankle with support during sports and practice activities for 12 months following injury.  PROGNOSIS   · If treated properly, ankle sprains can be expected to recover completely; however, the length of recovery depends on the degree of injury.  · A grade 1 sprain usually heals enough in 5 to 7 days to allow modified activity and requires an average of 6 weeks to heal completely.  · A grade 2 sprain requires   6 to 10 weeks to heal completely.  · A grade 3 sprain requires 12 to 16 weeks to heal.  · A syndesmosis sprain often takes more than 3 months to heal.  RELATED COMPLICATIONS   · Frequent recurrence of symptoms may result in a chronic problem. Appropriately addressing the problem the first time decreases the frequency of recurrence and optimizes healing time. Severity of the initial sprain does not predict the likelihood of later instability.  · Injury to other structures (bone, cartilage, or tendon).  · A chronically unstable or arthritic ankle joint is a possiblity with repeated  sprains.  TREATMENT  Treatment initially involves the use of ice, medication, and compression bandages to help reduce pain and inflammation. Ankle sprains are usually immobilized in a walking cast or boot to allow for healing. Crutches may be recommended to reduce pressure on the injury. After immobilization, strengthening and stretching exercises may be necessary to regain strength and a full range of motion. Surgery is rarely needed to treat ankle sprains.  MEDICATION   · Nonsteroidal anti-inflammatory medications, such as aspirin and ibuprofen (do not take for the first 3 days after injury or within 7 days before surgery), or other minor pain relievers, such as acetaminophen, are often recommended. Take these as directed by your caregiver. Contact your caregiver immediately if any bleeding, stomach upset, or signs of an allergic reaction occur from these medications.  · Ointments applied to the skin may be helpful.  · Pain relievers may be prescribed as necessary by your caregiver. Do not take prescription pain medication for longer than 4 to 7 days. Use only as directed and only as much as you need.  HEAT AND COLD  · Cold treatment (icing) is used to relieve pain and reduce inflammation for acute and chronic cases. Cold should be applied for 10 to 15 minutes every 2 to 3 hours for inflammation and pain and immediately after any activity that aggravates your symptoms. Use ice packs or an ice massage.  · Heat treatment may be used before performing stretching and strengthening activities prescribed by your caregiver. Use a heat pack or a warm soak.  SEEK IMMEDIATE MEDICAL CARE IF:   · Pain, swelling, or bruising worsens despite treatment.  · You experience pain, numbness, discoloration, or coldness in the foot or toes.  · New, unexplained symptoms develop (drugs used in treatment may produce side effects.)  EXERCISES   PHASE I EXERCISES  RANGE OF MOTION (ROM) AND STRETCHING EXERCISES - Ankle Sprain, Acute Phase I,  Weeks 1 to 2  These exercises may help you when beginning to restore flexibility in your ankle. You will likely work on these exercises for the 1 to 2 weeks after your injury. Once your physician, physical therapist, or athletic trainer sees adequate progress, he or she will advance your exercises. While completing these exercises, remember:   · Restoring tissue flexibility helps normal motion to return to the joints. This allows healthier, less painful movement and activity.  · An effective stretch should be held for at least 30 seconds.  · A stretch should never be painful. You should only feel a gentle lengthening or release in the stretched tissue.  RANGE OF MOTION - Dorsi/Plantar Flexion  · While sitting with your right / left knee straight, draw the top of your foot upwards by flexing your ankle. Then reverse the motion, pointing your toes downward.  · Hold each position for __________ seconds.  · After completing your first set of   exercises, repeat this exercise with your knee bent.  Repeat __________ times. Complete this exercise __________ times per day.   RANGE OF MOTION - Ankle Alphabet  · Imagine your right / left big toe is a pen.  · Keeping your hip and knee still, write out the entire alphabet with your "pen." Make the letters as large as you can without increasing any discomfort.  Repeat __________ times. Complete this exercise __________ times per day.   STRENGTHENING EXERCISES - Ankle Sprain, Acute -Phase I, Weeks 1 to 2  These exercises may help you when beginning to restore strength in your ankle. You will likely work on these exercises for 1 to 2 weeks after your injury. Once your physician, physical therapist, or athletic trainer sees adequate progress, he or she will advance your exercises. While completing these exercises, remember:   · Muscles can gain both the endurance and the strength needed for everyday activities through controlled exercises.  · Complete these exercises as instructed by  your physician, physical therapist, or athletic trainer. Progress the resistance and repetitions only as guided.  · You may experience muscle soreness or fatigue, but the pain or discomfort you are trying to eliminate should never worsen during these exercises. If this pain does worsen, stop and make certain you are following the directions exactly. If the pain is still present after adjustments, discontinue the exercise until you can discuss the trouble with your clinician.  STRENGTH - Dorsiflexors  · Secure a rubber exercise band/tubing to a fixed object (ie. table, pole) and loop the other end around your right / left foot.  · Sit on the floor facing the fixed object. The band/tubing should be slightly tense when your foot is relaxed.  · Slowly draw your foot back toward you using your ankle and toes.  · Hold this position for __________ seconds. Slowly release the tension in the band and return your foot to the starting position.  Repeat __________ times. Complete this exercise __________ times per day.   STRENGTH - Plantar-flexors   · Sit with your right / left leg extended. Holding onto both ends of a rubber exercise band/tubing, loop it around the ball of your foot. Keep a slight tension in the band.  · Slowly push your toes away from you, pointing them downward.  · Hold this position for __________ seconds. Return slowly, controlling the tension in the band/tubing.  Repeat __________ times. Complete this exercise __________ times per day.   STRENGTH - Ankle Eversion  · Secure one end of a rubber exercise band/tubing to a fixed object (table, pole). Loop the other end around your foot just before your toes.  · Place your fists between your knees. This will focus your strengthening at your ankle.  · Drawing the band/tubing across your opposite foot, slowly, pull your little toe out and up. Make sure the band/tubing is positioned to resist the entire motion.  · Hold this position for __________ seconds.  Have  your muscles resist the band/tubing as it slowly pulls your foot back to the starting position.   Repeat __________ times. Complete this exercise __________ times per day.   STRENGTH - Ankle Inversion  · Secure one end of a rubber exercise band/tubing to a fixed object (table, pole). Loop the other end around your foot just before your toes.  · Place your fists between your knees. This will focus your strengthening at your ankle.  · Slowly, pull your big toe up and in, making   sure the band/tubing is positioned to resist the entire motion.  · Hold this position for __________ seconds.  · Have your muscles resist the band/tubing as it slowly pulls your foot back to the starting position.  Repeat __________ times. Complete this exercises __________ times per day.   STRENGTH - Towel Curls  · Sit in a chair positioned on a non-carpeted surface.  · Place your right / left foot on a towel, keeping your heel on the floor.  · Pull the towel toward your heel by only curling your toes. Keep your heel on the floor.  · If instructed by your physician, physical therapist, or athletic trainer, add weight to the end of the towel.  Repeat __________ times. Complete this exercise __________ times per day.  Document Released: 10/26/2004 Document Revised: 06/19/2011 Document Reviewed: 07/09/2008  [employer]® Patient Information ©2014 [employer].

## 2013-06-22 ENCOUNTER — Emergency Department (INDEPENDENT_AMBULATORY_CARE_PROVIDER_SITE_OTHER): Payer: BC Managed Care – PPO

## 2013-06-22 ENCOUNTER — Emergency Department (INDEPENDENT_AMBULATORY_CARE_PROVIDER_SITE_OTHER)
Admission: EM | Admit: 2013-06-22 | Discharge: 2013-06-22 | Disposition: A | Discharge status: Home / Self Care | Payer: BC Managed Care – PPO | Source: Home / Self Care | Attending: Family Medicine | Admitting: Family Medicine

## 2013-06-22 ENCOUNTER — Encounter (HOSPITAL_COMMUNITY): Payer: Self-pay | Admitting: Emergency Medicine

## 2013-06-22 DIAGNOSIS — S339XXA Sprain of unspecified parts of lumbar spine and pelvis, initial encounter: Secondary | ICD-10-CM

## 2013-06-22 DIAGNOSIS — S335XXA Sprain of ligaments of lumbar spine, initial encounter: Secondary | ICD-10-CM

## 2013-06-22 DIAGNOSIS — S39012A Strain of muscle, fascia and tendon of lower back, initial encounter: Secondary | ICD-10-CM

## 2013-06-22 MED ORDER — CYCLOBENZAPRINE HCL 10 MG PO TABS: 10.0000 mg | ORAL_TABLET | Freq: Two times a day (BID) | ORAL | Status: AC | PRN

## 2013-06-22 MED ORDER — IBUPROFEN 800 MG PO TABS
ORAL_TABLET | ORAL | Status: AC
  Filled 2013-06-22: qty 1

## 2013-06-22 MED ORDER — HYDROCODONE-ACETAMINOPHEN 5-325 MG PO TABS
ORAL_TABLET | ORAL | Status: AC
  Filled 2013-06-22: qty 2

## 2013-06-22 MED ORDER — HYDROCODONE-ACETAMINOPHEN 5-325 MG PO TABS
2.0000 | ORAL_TABLET | Freq: Once | ORAL | Status: AC
  Administered 2013-06-22: 2 via ORAL

## 2013-06-22 MED ORDER — HYDROCODONE-ACETAMINOPHEN 5-325 MG PO TABS
1.0000 | ORAL_TABLET | ORAL | Status: AC | PRN
Start: 2013-06-22 — End: ?

## 2013-06-22 MED ORDER — IBUPROFEN 800 MG PO TABS
800.0000 mg | ORAL_TABLET | Freq: Once | ORAL | Status: AC
Start: 2013-06-22 — End: 2013-06-22
  Administered 2013-06-22: 800 mg via ORAL

## 2013-06-22 MED ORDER — TRAMADOL HCL 50 MG PO TABS: 50.0000 mg | ORAL_TABLET | Freq: Four times a day (QID) | ORAL | Status: AC | PRN

## 2013-06-22 NOTE — ED Notes (Signed)
C/o pain in low/mid back since Tuesday. No better w heating pad, flexaril, tramadol, dicofinac Rx for her fibromyalgia. No known injury. Denies other pain or radiation of back pain in to lower extremities

## 2013-06-22 NOTE — ED Provider Notes (Signed)
Medical screening examination/treatment/procedure(s) were performed by resident physician or non-physician practitioner and as supervising physician I was immediately available for consultation/collaboration.   Barkley BrunsKINDL,Braniyah Besse DOUGLAS MD.   Linna HoffJames D Rodrickus Min, MD 06/22/13 2016

## 2013-06-22 NOTE — ED Provider Notes (Signed)
CSN: [id number]     Arrival date & time 06/22/13  1823 History   First MD Initiated Contact with Patient 06/22/13 1834     Chief Complaint  Patient presents with  . Back Pain    Patient is a 38 y.o. female presenting with back pain. The history is provided by the patient.  Back Pain Location:  Lumbar spine Quality:  Aching and stabbing Radiates to:  Does not radiate Pain severity:  Severe Pain is:  Same all the time Onset quality:  Sudden Duration:  6 days Timing:  Constant Progression:  Worsening Chronicity:  New Context: not emotional stress, not falling, not jumping from heights, not lifting heavy objects, not MCA, not MVA, not occupational injury, not pedestrian accident, not physical stress, not recent illness, not recent injury and not twisting   Relieved by:  Nothing Worsened by:  Ambulation, bending and movement Ineffective treatments:  OTC medications Associated symptoms: no abdominal pain, no abdominal swelling, no bladder incontinence, no bowel incontinence, no chest pain, no fever, no headaches, no leg pain, no numbness, no paresthesias, no pelvic pain, no perianal numbness, no tingling, no weakness and no weight loss   Risk factors: lack of exercise and obesity   Risk factors: no hx of cancer, no hx of osteoporosis, no menopause, not pregnant, no recent surgery, no steroid use and no vascular disease   Pt reports sharp, stabbing pain to lower back when she attempted to rise from a chair Tuesday. The pain has persisted and worsened. Pain does radiate and is not associated w/ numbness, tingling or LE weakness. Past Medical History  Diagnosis Date  . Depression     15 years  . Fibromyalgia     2 years  . Hyperlipemia   . Obesity     12 years   Past Surgical History  Procedure Laterality Date  . Abdominal hysterectomy    . Hernia repair    . Cesarean section    . Tubal ligation     Family History  Problem Relation Age of Onset  . Ulcerative colitis      [employer]  . Cancer Father     Nasal/ Pharnyx   History  Substance Use Topics  . Smoking status: Former Games developer  . Smokeless tobacco: Never Used  . Alcohol Use: Yes     Comment: occasionally   OB History   Grav Para Term Preterm Abortions TAB SAB Ect Mult Living                 Review of Systems  Constitutional: Negative for fever and weight loss.  Cardiovascular: Negative for chest pain.  Gastrointestinal: Negative for abdominal pain and bowel incontinence.  Genitourinary: Negative for bladder incontinence and pelvic pain.  Musculoskeletal: Positive for back pain.  Neurological: Negative for tingling, weakness, numbness, headaches and paresthesias.  All other systems reviewed and are negative.    Allergies  Cymbalta  Home Medications   Current Outpatient Rx  Name  Route  Sig  Dispense  Refill  . cyclobenzaprine (FLEXERIL) 10 MG tablet   Oral   Take 10 mg by mouth 3 (three) times daily as needed.         . cyclobenzaprine (FLEXERIL) 10 MG tablet   Oral   Take 1 tablet (10 mg total) by mouth 2 (two) times daily as needed for muscle spasms.   20 tablet   0   . diclofenac (VOLTAREN) 50 MG EC tablet   Oral  Take 50 mg by mouth 2 (two) times daily.         Marland Kitchen. HYDROcodone-acetaminophen (NORCO/VICODIN) 5-325 MG per tablet   Oral   Take 1 tablet by mouth every 4 (four) hours as needed.   10 tablet   0   . naproxen (NAPROSYN) 500 MG tablet   Oral   Take 1 tablet (500 mg total) by mouth 2 (two) times daily with a meal. For one week then as needed   60 tablet   1   . traMADol (ULTRAM) 50 MG tablet   Oral   Take 2 tablets (100 mg total) by mouth every 8 (eight) hours as needed for pain.   30 tablet   0   . traMADol (ULTRAM) 50 MG tablet   Oral   Take 1 tablet (50 mg total) by mouth every 6 (six) hours as needed.   15 tablet   0    BP 134/76  Pulse 97  Temp(Src) 99.3 F (37.4 C) (Oral)  Resp 18  SpO2 100% Physical Exam  Constitutional: She is oriented  to person, place, and time. She appears well-developed and well-nourished.  Morbidly obese  HENT:  Head: Normocephalic and atraumatic.  Eyes: Conjunctivae are normal.  Cardiovascular: Normal rate.   Pulmonary/Chest: Effort normal.  Musculoskeletal:  TTP over bony  LS spine region. Exam somewhat limited by pt's body habitus and pain.   Neurological: She is alert and oriented to person, place, and time.  Skin: Skin is warm and dry.  Psychiatric: She has a normal mood and affect.    ED Course  Procedures (including critical care time) Labs Review Labs Reviewed - No data to display Imaging Review Dg Lumbar Spine Complete  06/22/2013   CLINICAL DATA:  Low back pain since 06/17/2013.  No trauma.  EXAM: LUMBAR SPINE - COMPLETE 4+ VIEW  COMPARISON:  DG ABD 1 VIEW dated 10/06/2012; CT ABD/PELVIS W CM dated 02/23/2010  FINDINGS: Five lumbar type vertebral bodies. Sacroiliac joints are symmetric. Maintenance of vertebral body height and alignment. Intervertebral disc heights are maintained.  IMPRESSION: No acute osseous abnormality.   Electronically Signed   By: Jeronimo GreavesKyle  Talbot M.D.   On: 06/22/2013 19:41   Dg Sacrum/coccyx  06/22/2013   CLINICAL DATA:  Pain without trauma.  EXAM: SACRUM AND COCCYX - 2+ VIEW  COMPARISON:  DG LUMBAR SPINE COMPLETE dated 06/22/2013; DG ABD 1 VIEW dated 10/06/2012  FINDINGS: Sacroiliac joints are symmetric. Mild degenerative changes are identified within both sacroiliac joints. No acute fracture or dislocation.  IMPRESSION: No acute osseous abnormality.   Electronically Signed   By: Jeronimo GreavesKyle  Talbot M.D.   On: 06/22/2013 19:45     MDM   1. Lumbosacral strain    Imaging negative. Reports relief w/ Ibuprofen and Vicodin given here. Will treat for lumbosacral strain w/ Flexeril and Vicodin. Pt to arrange f/u w/ her orthopedist. Will refill Tramadol (#15) x 1 per pt request. Pt agreeable w/ plan.    Leanne ChangKatherine P Marybel Alcott, NP 06/22/13 2014

## 2013-11-10 ENCOUNTER — Encounter (HOSPITAL_COMMUNITY): Payer: Self-pay | Admitting: Emergency Medicine

## 2013-11-10 ENCOUNTER — Emergency Department (INDEPENDENT_AMBULATORY_CARE_PROVIDER_SITE_OTHER)
Admission: EM | Admit: 2013-11-10 | Discharge: 2013-11-10 | Disposition: A | Discharge status: Home / Self Care | Payer: BC Managed Care – PPO | Source: Home / Self Care | Attending: Family Medicine | Admitting: Family Medicine

## 2013-11-10 DIAGNOSIS — R197 Diarrhea, unspecified: Secondary | ICD-10-CM

## 2013-11-10 LAB — POCT URINALYSIS DIP (DEVICE)
BILIRUBIN URINE: NEGATIVE
Glucose, UA: NEGATIVE mg/dL
Ketones, ur: NEGATIVE mg/dL
LEUKOCYTES UA: NEGATIVE
NITRITE: NEGATIVE
PH: 6.5 (ref 5.0–8.0)
Protein, ur: NEGATIVE mg/dL
Specific Gravity, Urine: 1.02 (ref 1.005–1.030)
UROBILINOGEN UA: 0.2 mg/dL (ref 0.0–1.0)

## 2013-11-10 NOTE — ED Notes (Signed)
C/o general abdominal pain x 2 weeks, which she relates to eating fast food that she thinks caused her to have cramping, diarrhea. Hist of hysterectomy

## 2013-11-10 NOTE — ED Provider Notes (Signed)
Medical screening examination/treatment/procedure(s) were performed by resident physician or non-physician practitioner and as supervising physician I was immediately available for consultation/collaboration.   Charmayne Odell DOUGLAS MD.   Vanderbilt Ranieri D Winnie Barsky, MD 11/10/13 2029 

## 2013-11-10 NOTE — Discharge Instructions (Signed)
Chronic Diarrhea Diarrhea is frequent loose and watery bowel movements. It can cause you to feel weak and dehydrated. Dehydration can cause you to become tired and thirsty and to have a dry mouth, decreased urination, and dark yellow urine. Diarrhea is a sign of another problem, most often an infection that will not last long. In most cases, diarrhea lasts 2-3 days. Diarrhea that lasts longer than 4 weeks is called long-lasting (chronic) diarrhea. It is important to treat your diarrhea as directed by your health care provider to lessen or prevent future episodes of diarrhea.  CAUSES  There are many causes of chronic diarrhea. The following are some possible causes:   Gastrointestinal infections caused by viruses, bacteria, or parasites.   Food poisoning or food allergies.   Certain medicines, such as antibiotics, chemotherapy, and laxatives.   Artificial sweeteners and fructose.   Digestive disorders, such as celiac disease and inflammatory bowel diseases.   Irritable bowel syndrome.  Some disorders of the pancreas.  Disorders of the thyroid.  Reduced blood flow to the intestines.  Cancer. Sometimes the cause of chronic diarrhea is unknown. RISK FACTORS  Having a severely weakened immune system, such as from HIV or AIDS.   Taking certain types of cancer-fighting drugs (such as with chemotherapy) or other medicines.   Having had a recent organ transplant.   Having a portion of the stomach or small bowel removed.   Traveling to countries where food and water supplies are often contaminated.  SYMPTOMS  In addition to frequent, loose stools, diarrhea may cause:   Cramping.   Abdominal pain.   Nausea.   Fever.  Fatigue.  Urgent need to use the bathroom.  Loss of bowel control. DIAGNOSIS  Your health care provider must take a careful history and perform a physical exam. Tests given are based on your symptoms and history. Tests may include:   Blood or  stool tests. Three or more stool samples may be examined. Stool cultures may be used to test for bacteria or parasites.   X-rays.   A procedure in which a thin tube is inserted into the mouth or rectum (endoscopy). This allows the health care provider to look inside the intestine.  TREATMENT   Treatment is aimed at correcting the cause of the diarrhea when possible.  Diarrhea caused by an infection can often be treated with antibiotic medicines.  Diarrhea not caused by an infection may require you to take long-term medicine or have surgery. Specific treatment should be discussed with your health care provider.  If the cause cannot be determined, treatment aims to relieve symptoms and prevent dehydration. Serious health problems can occur if you do not maintain proper fluid levels. Treatment may include:  Taking an oral rehydration solution (ORS).  Not drinking beverages that contain caffeine (such as tea, coffee, and soft drinks).  Not drinking alcohol.  Maintaining well-balanced nutrition to help you recover faster. HOME CARE INSTRUCTIONS   Drink enough fluids to keep urine clear or pale yellow. Drink 1 cup (8 oz) of fluid for each diarrhea episode. Avoid fluids that contain simple sugars, fruit juices, whole milk products, and sodas. Hydrate with an ORS. You may purchase the ORS or prepare it at home by mixing the following ingredients together:   - tsp (1.7-3  mL) table salt.   tsp (3  mL) baking soda.   tsp (1.7 mL) salt substitute containing potassium chloride.  1 tbsp (20 mL) sugar.  4.2 c (1 L) of water.  Certain foods and beverages may increase the speed at which food moves through the gastrointestinal (GI) tract. These foods and beverages should be avoided. They include:  Caffeinated and alcoholic beverages.  High-fiber foods, such as raw fruits and vegetables, nuts, seeds, and whole grain breads and cereals.  Foods and beverages sweetened with sugar  alcohols, such as xylitol, sorbitol, and mannitol.   Some foods may be well tolerated and may help thicken stool. These include:  Starchy foods, such as rice, toast, pasta, low-sugar cereal, oatmeal, grits, baked potatoes, crackers, and bagels.  Bananas.  Applesauce.  Add probiotic-rich foods to help increase healthy bacteria in the GI tract. These include yogurt and fermented milk products.  Wash your hands well after each diarrhea episode.  Only take over-the-counter or prescription medicines as directed by your health care provider.  Take a warm bath to relieve any burning or pain from frequent diarrhea episodes. SEEK MEDICAL CARE IF:   You are not urinating as often.  Your urine is a dark color.  You become very tired or dizzy.  You have severe pain in the abdomen or rectum.  Your have blood or pus in your stools.  Your stools look black and tarry. SEEK IMMEDIATE MEDICAL CARE IF:   You are unable to keep fluids down.  You have persistent vomiting.  You have blood in your stool.  Your stools are black and tarry.  You do not urinate in 6-8 hours, or there is only a small amount of very dark urine.  You have abdominal pain that increases or localizes.  You have weakness, dizziness, confusion, or lightheadedness.  You have a severe headache.  Your diarrhea gets worse or does not get better.  You have a fever or persistent symptoms for more than 2-3 days.  You have a fever and your symptoms suddenly get worse. MAKE SURE YOU:   Understand these instructions.  Will watch your condition.  Will get help right away if you are not doing well or get worse. Document Released: 06/17/2003 Document Revised: 04/01/2013 Document Reviewed: 09/19/2012 [facility] Patient Information 2015 [place], [place]. This information is not intended to replace advice given to you by your health care provider. Make sure you discuss any questions you have with your health care  provider.  Diarrhea Diarrhea is frequent loose and watery bowel movements. It can cause you to feel weak and dehydrated. Dehydration can cause you to become tired and thirsty, have a dry mouth, and have decreased urination that often is dark yellow. Diarrhea is a sign of another problem, most often an infection that will not last long. In most cases, diarrhea typically lasts 2-3 days. However, it can last longer if it is a sign of something more serious. It is important to treat your diarrhea as directed by your caregiver to lessen or prevent future episodes of diarrhea. CAUSES  Some common causes include:  Gastrointestinal infections caused by viruses, bacteria, or parasites.  Food poisoning or food allergies.  Certain medicines, such as antibiotics, chemotherapy, and laxatives.  Artificial sweeteners and fructose.  Digestive disorders. HOME CARE INSTRUCTIONS  Ensure adequate fluid intake (hydration): Have 1 cup (8 oz) of fluid for each diarrhea episode. Avoid fluids that contain simple sugars or sports drinks, fruit juices, whole milk products, and sodas. Your urine should be clear or pale yellow if you are drinking enough fluids. Hydrate with an oral rehydration solution that you can purchase at pharmacies, retail stores, and online. You can prepare an oral rehydration  solution at home by mixing the following ingredients together:   - tsp table salt.   tsp baking soda.   tsp salt substitute containing potassium chloride.  1  tablespoons sugar.  1 L (34 oz) of water.  Certain foods and beverages may increase the speed at which food moves through the gastrointestinal (GI) tract. These foods and beverages should be avoided and include:  Caffeinated and alcoholic beverages.  High-fiber foods, such as raw fruits and vegetables, nuts, seeds, and whole grain breads and cereals.  Foods and beverages sweetened with sugar alcohols, such as xylitol, sorbitol, and mannitol.  Some foods  may be well tolerated and may help thicken stool including:  Starchy foods, such as rice, toast, pasta, low-sugar cereal, oatmeal, grits, baked potatoes, crackers, and bagels.  Bananas.  Applesauce.  Add probiotic-rich foods to help increase healthy bacteria in the GI tract, such as yogurt and fermented milk products.  Wash your hands well after each diarrhea episode.  Only take over-the-counter or prescription medicines as directed by your caregiver.  Take a warm bath to relieve any burning or pain from frequent diarrhea episodes. SEEK IMMEDIATE MEDICAL CARE IF:   You are unable to keep fluids down.  You have persistent vomiting.  You have blood in your stool, or your stools are black and tarry.  You do not urinate in 6-8 hours, or there is only a small amount of very dark urine.  You have abdominal pain that increases or localizes.  You have weakness, dizziness, confusion, or light-headedness.  You have a severe headache.  Your diarrhea gets worse or does not get better.  You have a fever or persistent symptoms for more than 2-3 days.  You have a fever and your symptoms suddenly get worse. MAKE SURE YOU:   Understand these instructions.  Will watch your condition.  Will get help right away if you are not doing well or get worse. Document Released: 03/17/2002 Document Revised: 08/11/2013 Document Reviewed: 12/03/2011 [facility] Patient Information 2015 [place], [place]. This information is not intended to replace advice given to you by your health care provider. Make sure you discuss any questions you have with your health care provider.  Food Choices to Help Relieve Diarrhea When you have diarrhea, the foods you eat and your eating habits are very important. Choosing the right foods and drinks can help relieve diarrhea. Also, because diarrhea can last up to 7 days, you need to replace lost fluids and electrolytes (such as sodium, potassium, and chloride) in order to help  prevent dehydration.  WHAT GENERAL GUIDELINES DO I NEED TO FOLLOW?  Slowly drink 1 cup (8 oz) of fluid for each episode of diarrhea. If you are getting enough fluid, your urine will be clear or pale yellow.  Eat starchy foods. Some good choices include white rice, white toast, pasta, low-fiber cereal, baked potatoes (without the skin), saltine crackers, and bagels.  Avoid large servings of any cooked vegetables.  Limit fruit to two servings per day. A serving is  cup or 1 small piece.  Choose foods with less than 2 g of fiber per serving.  Limit fats to less than 8 tsp (38 g) per day.  Avoid fried foods.  Eat foods that have probiotics in them. Probiotics can be found in certain dairy products.  Avoid foods and beverages that may increase the speed at which food moves through the stomach and intestines (gastrointestinal tract). Things to avoid include:  High-fiber foods, such as dried fruit, raw  fruits and vegetables, nuts, seeds, and whole grain foods.  Spicy foods and high-fat foods.  Foods and beverages sweetened with high-fructose corn syrup, honey, or sugar alcohols such as xylitol, sorbitol, and mannitol. WHAT FOODS ARE RECOMMENDED? Grains White rice. White, [place], or pita breads (fresh or toasted), including plain rolls, buns, or bagels. White pasta. Saltine, soda, or graham crackers. Pretzels. Low-fiber cereal. Cooked cereals made with water (such as cornmeal, farina, or cream cereals). Plain muffins. Matzo. Melba toast. Zwieback.  Vegetables Potatoes (without the skin). Strained tomato and vegetable juices. Most well-cooked and canned vegetables without seeds. Tender lettuce. Fruits Cooked or canned applesauce, apricots, cherries, fruit cocktail, grapefruit, peaches, pears, or plums. Fresh bananas, apples without skin, cherries, grapes, cantaloupe, grapefruit, peaches, oranges, or plums.  Meat and Other Protein Products Baked or boiled chicken. Eggs. Tofu. Fish. Seafood.  Smooth peanut butter. Ground or well-cooked tender beef, ham, veal, lamb, pork, or poultry.  Dairy Plain yogurt, kefir, and unsweetened liquid yogurt. Lactose-free milk, buttermilk, or soy milk. Plain hard cheese. Beverages Sport drinks. Clear broths. Diluted fruit juices (except prune). Regular, caffeine-free sodas such as ginger ale. Water. Decaffeinated teas. Oral rehydration solutions. Sugar-free beverages not sweetened with sugar alcohols. Other Bouillon, broth, or soups made from recommended foods.  The items listed above may not be a complete list of recommended foods or beverages. Contact your dietitian for more options. WHAT FOODS ARE NOT RECOMMENDED? Grains Whole grain, whole wheat, bran, or rye breads, rolls, pastas, crackers, and cereals. Wild or brown rice. Cereals that contain more than 2 g of fiber per serving. Corn tortillas or taco shells. Cooked or dry oatmeal. Granola. Popcorn. Vegetables Raw vegetables. Cabbage, broccoli, Brussels sprouts, artichokes, baked beans, beet greens, corn, kale, legumes, peas, sweet potatoes, and yams. Potato skins. Cooked spinach and cabbage. Fruits Dried fruit, including raisins and dates. Raw fruits. Stewed or dried prunes. Fresh apples with skin, apricots, mangoes, pears, raspberries, and strawberries.  Meat and Other Protein Products Chunky peanut butter. Nuts and seeds. Beans and lentils. Tomasa BlaseBacon.  Dairy High-fat cheeses. Milk, chocolate milk, and beverages made with milk, such as milk shakes. Cream. Ice cream. Sweets and Desserts Sweet rolls, doughnuts, and sweet breads. Pancakes and waffles. Fats and Oils Butter. Cream sauces. Margarine. Salad oils. Plain salad dressings. Olives. Avocados.  Beverages Caffeinated beverages (such as coffee, tea, soda, or energy drinks). Alcoholic beverages. Fruit juices with pulp. Prune juice. Soft drinks sweetened with high-fructose corn syrup or sugar alcohols. Other Coconut. Hot sauce. Chili powder.  Mayonnaise. Gravy. Cream-based or milk-based soups.  The items listed above may not be a complete list of foods and beverages to avoid. Contact your dietitian for more information. WHAT SHOULD I DO IF I BECOME DEHYDRATED? Diarrhea can sometimes lead to dehydration. Signs of dehydration include dark urine and dry mouth and skin. If you think you are dehydrated, you should rehydrate with an oral rehydration solution. These solutions can be purchased at pharmacies, retail stores, or online.  Drink -1 cup (120-240 mL) of oral rehydration solution each time you have an episode of diarrhea. If drinking this amount makes your diarrhea worse, try drinking smaller amounts more often. For example, drink 1-3 tsp (5-15 mL) every 5-10 minutes.  A general rule for staying hydrated is to drink 1-2 L of fluid per day. Talk to your health care provider about the specific amount you should be drinking each day. Drink enough fluids to keep your urine clear or pale yellow. Document Released: 06/17/2003 Document Revised: 04/01/2013  Document Reviewed: 02/17/2013 [facility] Patient Information 2015 [place], [place]. This information is not intended to replace advice given to you by your health care provider. Make sure you discuss any questions you have with your health care provider.

## 2013-11-10 NOTE — ED Provider Notes (Signed)
CSN: [id number]     Arrival date & time 11/10/13  1546 History   First MD Initiated Contact with Patient 11/10/13 1704     Chief Complaint  Patient presents with  . Abdominal Pain   (Consider location/radiation/quality/duration/timing/severity/associated sxs/prior Treatment) HPI Comments: Patient present with 2 week history of 4-7 episodes of non-bloody diarrhea a day with occasional nausea. Denies fever, vomiting or abdominal pain. Denies melena. Denies GU sx. Denies recent travel, camping, antibiotic use, surgery, or eating raw meat or seafood. She feels symptoms began after eating a meal at a local fast foot restaurant.  PCP: Sadie Haber at [facility] in Nurse, mental health.  Patient is a 38 y.o. female presenting with abdominal pain. The history is provided by the patient.  Abdominal Pain   Past Medical History  Diagnosis Date  . Depression     15 years  . Fibromyalgia     2 years  . Hyperlipemia   . Obesity     12 years   Past Surgical History  Procedure Laterality Date  . Abdominal hysterectomy    . Hernia repair    . Cesarean section    . Tubal ligation     Family History  Problem Relation Age of Onset  . Ulcerative colitis      [employer]  . Cancer Father     Nasal/ Pharnyx   History  Substance Use Topics  . Smoking status: Former Research scientist (life sciences)  . Smokeless tobacco: Never Used  . Alcohol Use: Yes     Comment: occasionally   OB History   Grav Para Term Preterm Abortions TAB SAB Ect Mult Living                 Review of Systems  Gastrointestinal: Positive for abdominal pain.  All other systems reviewed and are negative.   Allergies  Cymbalta  Home Medications   Prior to Admission medications   Medication Sig Start Date End Date Taking? Authorizing Provider  cyclobenzaprine (FLEXERIL) 10 MG tablet Take 10 mg by mouth 3 (three) times daily as needed.    Historical Provider, MD  cyclobenzaprine (FLEXERIL) 10 MG tablet Take 1 tablet (10 mg total) by mouth 2  (two) times daily as needed for muscle spasms. 06/22/13   Rhetta Mura Schorr, NP  diclofenac (VOLTAREN) 50 MG EC tablet Take 50 mg by mouth 2 (two) times daily.    Historical Provider, MD  HYDROcodone-acetaminophen (NORCO/VICODIN) 5-325 MG per tablet Take 1 tablet by mouth every 4 (four) hours as needed. 06/22/13   Rhetta Mura Schorr, NP  naproxen (NAPROSYN) 500 MG tablet Take 1 tablet (500 mg total) by mouth 2 (two) times daily with a meal. For one week then as needed 02/24/12   Awilda Metro, NP  traMADol (ULTRAM) 50 MG tablet Take 2 tablets (100 mg total) by mouth every 8 (eight) hours as needed for pain. 04/04/12   Harden Mo, MD  traMADol (ULTRAM) 50 MG tablet Take 1 tablet (50 mg total) by mouth every 6 (six) hours as needed. 06/22/13   Rhetta Mura Schorr, NP   BP 131/84  Pulse 78  Temp(Src) 98.2 F (36.8 C) (Oral)  Resp 17  SpO2 97% Physical Exam  Nursing note and vitals reviewed. Constitutional: She is oriented to person, place, and time. She appears well-developed and well-nourished. No distress.  +Obese  HENT:  Head: Normocephalic and atraumatic.  Mouth/Throat: Oropharynx is clear and moist.  Eyes: Conjunctivae are normal. No scleral icterus.  Cardiovascular: Normal  rate, regular rhythm and normal heart sounds.   Pulmonary/Chest: Effort normal and breath sounds normal.  Abdominal: Soft. Bowel sounds are normal. She exhibits no distension. There is no tenderness.  +exam limited by body habitus  Musculoskeletal: Normal range of motion.  Neurological: She is alert and oriented to person, place, and time.  Skin: Skin is warm and dry. No rash noted. No erythema.  Psychiatric: She has a normal mood and affect. Her behavior is normal.    ED Course  Procedures (including critical care time) Labs Review Labs Reviewed  POCT URINALYSIS DIP (DEVICE) - Abnormal; Notable for the following:    Hgb urine dipstick TRACE (*)    All other components within normal limits  STOOL  CULTURE  OVA AND PARASITE EXAMINATION  CLOSTRIDIUM DIFFICILE BY PCR    Imaging Review No results found.   MDM   1. Diarrhea    Patient unable to provide stool specimen while at [facility]. Sent with stool collection kit for home and advised to refrigerate specimen and return for testing. Advised if specimen is negative, will refer to GI for follow up (Drs. Collene Mares & Winter).    [place], [place] 11/10/13 [phone]

## 2015-03-24 IMAGING — CR DG SACRUM/COCCYX 2+V
3 series · 3 of 3 positions shown · non-contrast
Comparison: DG LUMBAR SPINE COMPLETE dated 06/22/2013; DG ABD 1 VIEW
dated 10/06/2012

CLINICAL DATA: Pain without trauma.

EXAM:
SACRUM AND COCCYX - 2+ VIEW

[view not recorded (1 of 3)]
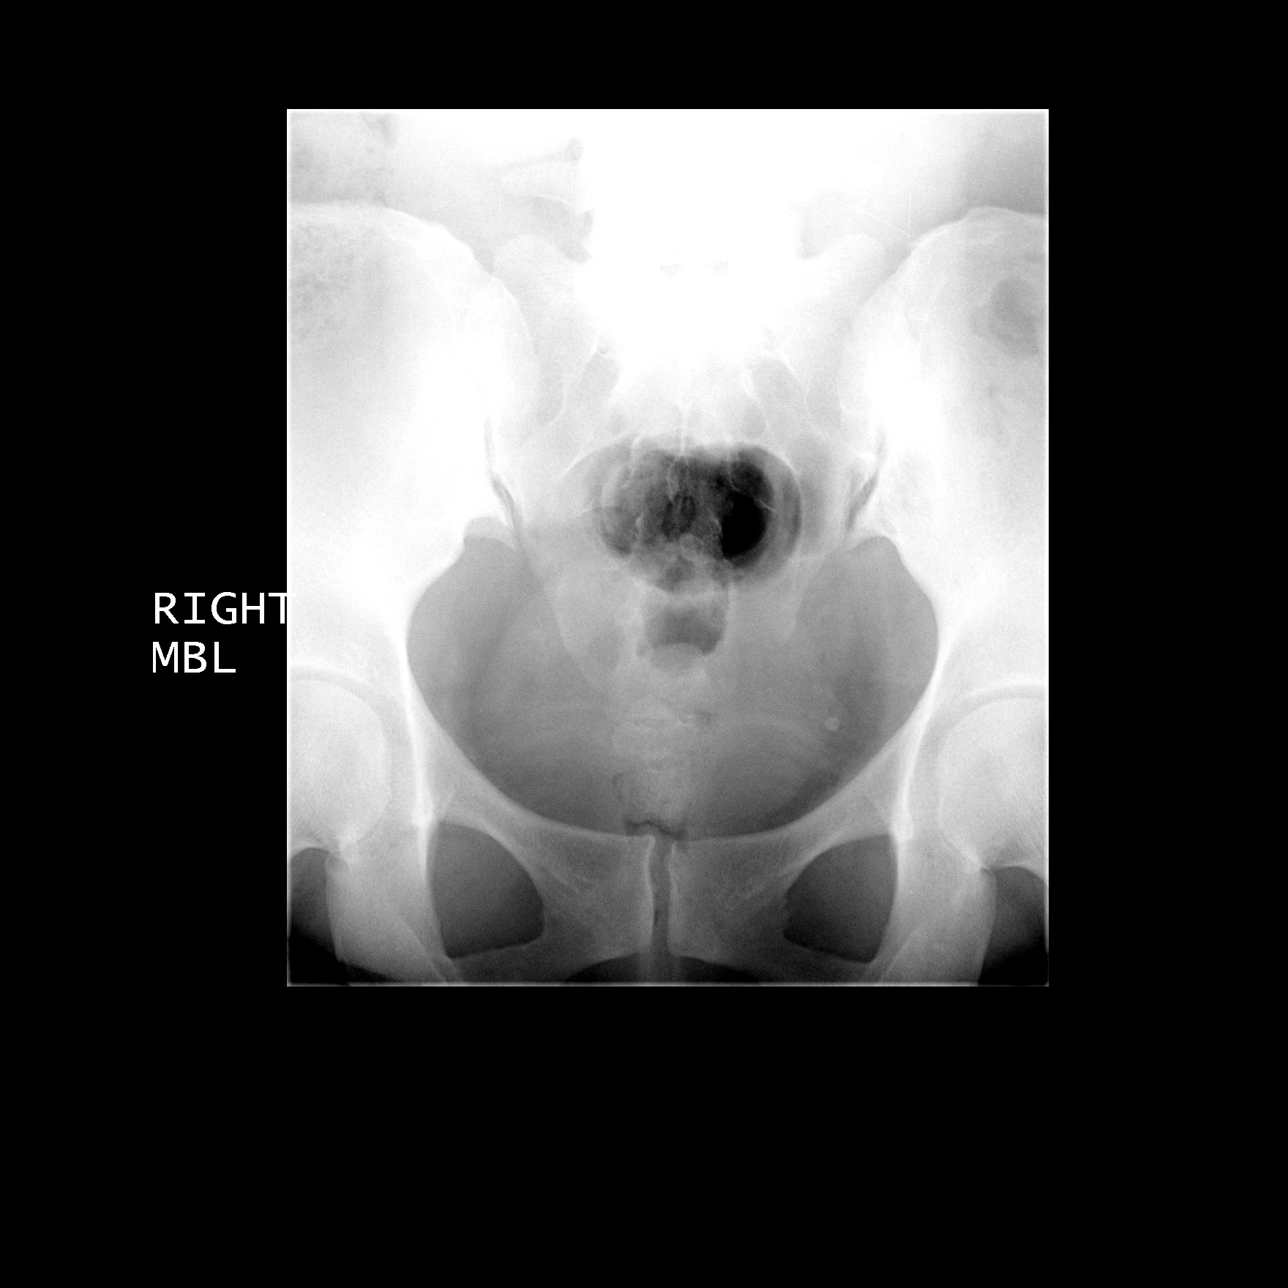

[view not recorded (2 of 3)]
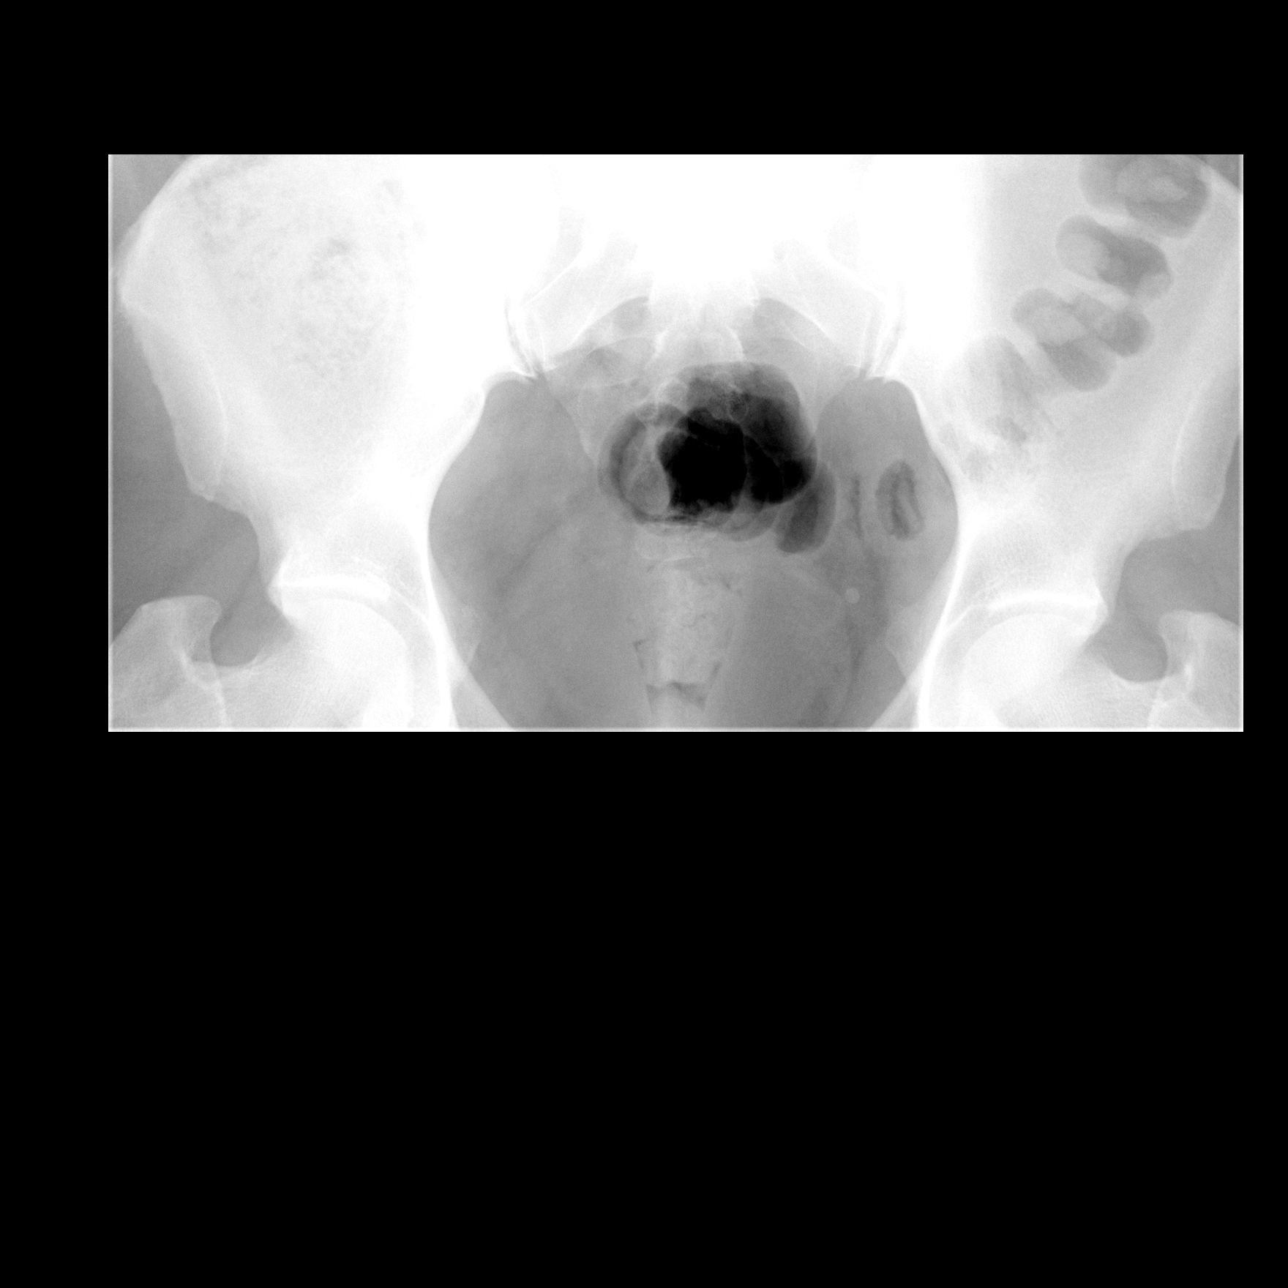

[view not recorded (3 of 3)]
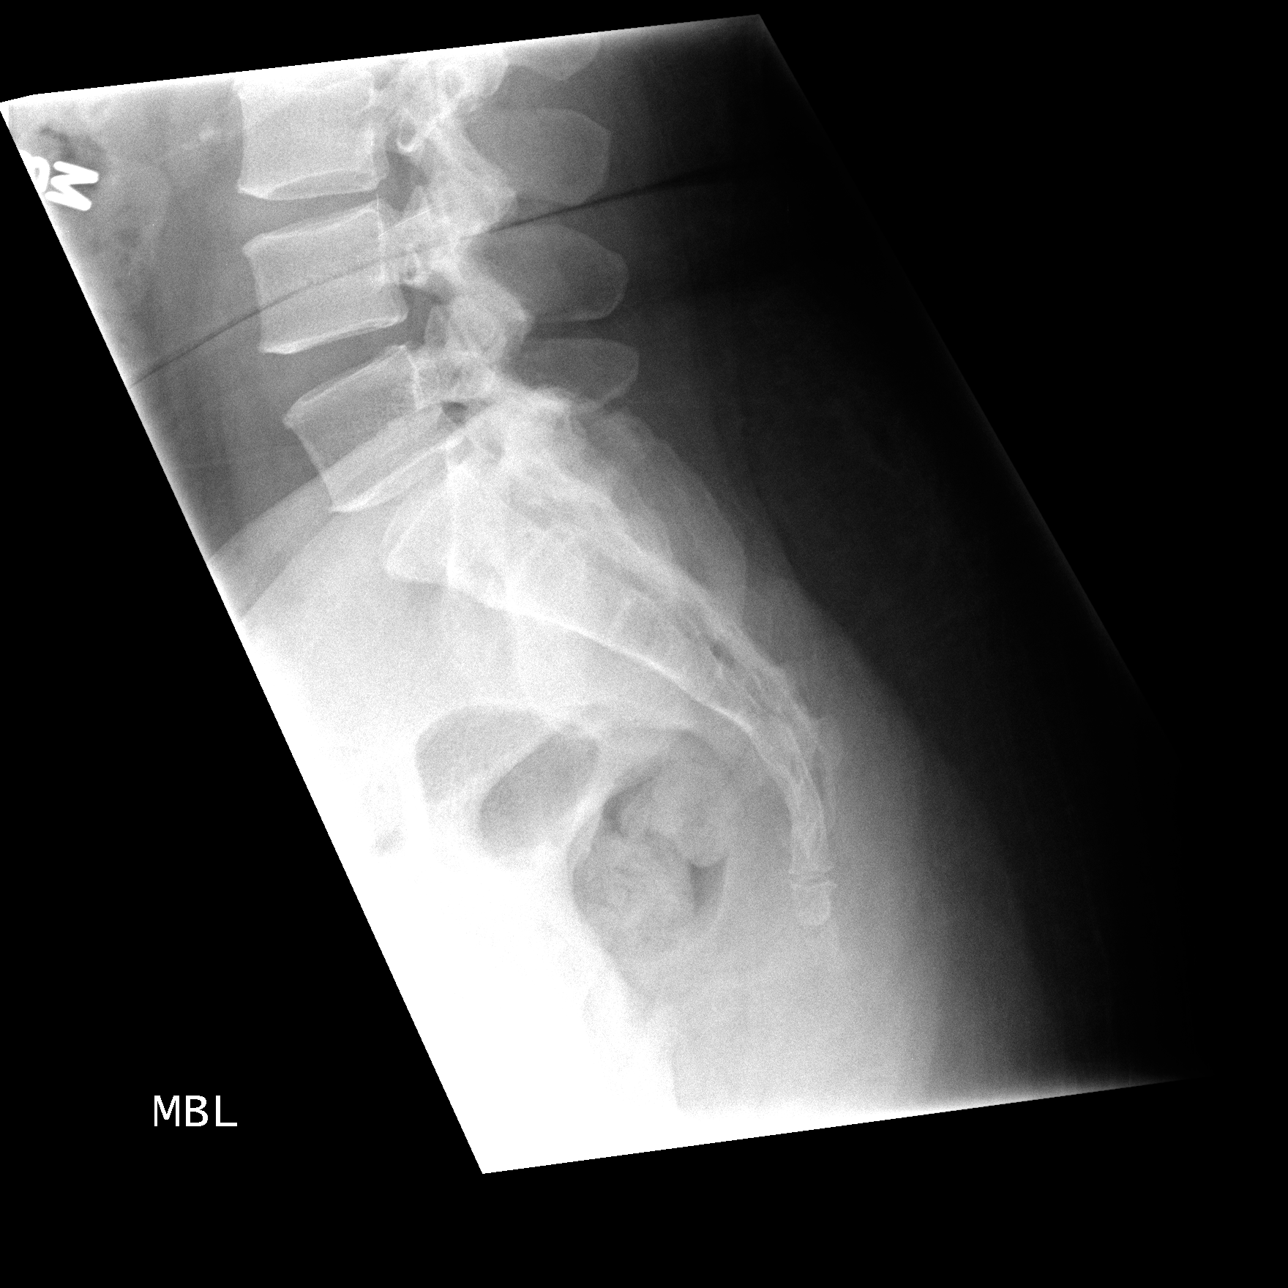

[3 of 3 positions shown; findings below may reference images not displayed]

FINDINGS: Sacroiliac joints are symmetric. Mild degenerative changes are
identified within both sacroiliac joints. No acute fracture or
dislocation.
IMPRESSION: No acute osseous abnormality.
